# Patient Record
Sex: Female | Born: 1990 | Race: White | Hispanic: No | Smoking: Never smoker
Health system: Southern US, Community
[De-identification: ages and names within clinical notes are randomized; demographics above are authoritative.]

## PROBLEM LIST (undated history)

## (undated) DIAGNOSIS — R531 Weakness: Secondary | ICD-10-CM

## (undated) DIAGNOSIS — E538 Deficiency of other specified B group vitamins: Secondary | ICD-10-CM

## (undated) DIAGNOSIS — F5104 Psychophysiologic insomnia: Secondary | ICD-10-CM

## (undated) DIAGNOSIS — R519 Headache, unspecified: Secondary | ICD-10-CM

## (undated) DIAGNOSIS — F419 Anxiety disorder, unspecified: Secondary | ICD-10-CM

## (undated) DIAGNOSIS — E559 Vitamin D deficiency, unspecified: Secondary | ICD-10-CM

## (undated) DIAGNOSIS — G629 Polyneuropathy, unspecified: Secondary | ICD-10-CM

## (undated) DIAGNOSIS — R413 Other amnesia: Secondary | ICD-10-CM

## (undated) DIAGNOSIS — R51 Headache: Secondary | ICD-10-CM

## (undated) HISTORY — DX: Psychophysiologic insomnia: F51.04

## (undated) HISTORY — PX: CHOLECYSTECTOMY: SHX55

## (undated) HISTORY — PX: OTHER SURGICAL HISTORY: SHX169

## (undated) HISTORY — DX: Other amnesia: R41.3

## (undated) HISTORY — DX: Vitamin D deficiency, unspecified: E55.9

## (undated) HISTORY — DX: Headache: R51

## (undated) HISTORY — DX: Deficiency of other specified B group vitamins: E53.8

## (undated) HISTORY — DX: Headache, unspecified: R51.9

## (undated) HISTORY — DX: Polyneuropathy, unspecified: G62.9

---
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
## 2013-07-17 ENCOUNTER — Emergency Department: Payer: Self-pay | Admitting: Emergency Medicine

## 2013-07-17 LAB — CBC
HCT: 46.1 % (ref 35.0–47.0)
HGB: 15.4 g/dL (ref 12.0–16.0)
MCH: 30.6 pg (ref 26.0–34.0)
MCHC: 33.4 g/dL (ref 32.0–36.0)
MCV: 92 fL (ref 80–100)
Platelet: 277 10*3/uL (ref 150–440)
RBC: 5.03 10*6/uL (ref 3.80–5.20)
RDW: 12.9 % (ref 11.5–14.5)
WBC: 7.2 10*3/uL (ref 3.6–11.0)

## 2013-07-17 LAB — URINALYSIS, COMPLETE
BACTERIA: NONE SEEN
BLOOD: NEGATIVE
Bilirubin,UR: NEGATIVE
GLUCOSE, UR: NEGATIVE mg/dL (ref 0–75)
Ketone: NEGATIVE
LEUKOCYTE ESTERASE: NEGATIVE
Nitrite: NEGATIVE
Ph: 6 (ref 4.5–8.0)
Protein: NEGATIVE
RBC,UR: 1 /HPF (ref 0–5)
SPECIFIC GRAVITY: 1.012 (ref 1.003–1.030)
Squamous Epithelial: 2
WBC UR: NONE SEEN /HPF (ref 0–5)

## 2013-07-17 LAB — COMPREHENSIVE METABOLIC PANEL
ALBUMIN: 4 g/dL (ref 3.4–5.0)
ANION GAP: 9 (ref 7–16)
Alkaline Phosphatase: 55 U/L
BUN: 8 mg/dL (ref 7–18)
Bilirubin,Total: 0.4 mg/dL (ref 0.2–1.0)
CO2: 23 mmol/L (ref 21–32)
CREATININE: 0.83 mg/dL (ref 0.60–1.30)
Calcium, Total: 9.1 mg/dL (ref 8.5–10.1)
Chloride: 106 mmol/L (ref 98–107)
Glucose: 83 mg/dL (ref 65–99)
OSMOLALITY: 273 (ref 275–301)
POTASSIUM: 3.6 mmol/L (ref 3.5–5.1)
SGOT(AST): 25 U/L (ref 15–37)
SGPT (ALT): 19 U/L (ref 12–78)
Sodium: 138 mmol/L (ref 136–145)
Total Protein: 8.4 g/dL — ABNORMAL HIGH (ref 6.4–8.2)

## 2013-07-17 LAB — PREGNANCY, URINE: Pregnancy Test, Urine: NEGATIVE m[IU]/mL

## 2013-07-17 LAB — TROPONIN I

## 2013-07-18 ENCOUNTER — Emergency Department: Payer: Self-pay | Admitting: Emergency Medicine

## 2013-07-18 LAB — COMPREHENSIVE METABOLIC PANEL
AST: 19 U/L (ref 15–37)
Albumin: 4.1 g/dL (ref 3.4–5.0)
Alkaline Phosphatase: 60 U/L
Anion Gap: 9 (ref 7–16)
BUN: 7 mg/dL (ref 7–18)
Bilirubin,Total: 0.5 mg/dL (ref 0.2–1.0)
Calcium, Total: 9.5 mg/dL (ref 8.5–10.1)
Chloride: 104 mmol/L (ref 98–107)
Co2: 24 mmol/L (ref 21–32)
Creatinine: 0.7 mg/dL (ref 0.60–1.30)
EGFR (African American): >60
EGFR (Non-African Amer.): >60
Glucose: 89 mg/dL (ref 65–99)
Osmolality: 271 (ref 275–301)
POTASSIUM: 3.9 mmol/L (ref 3.5–5.1)
SGPT (ALT): 20 U/L (ref 12–78)
Sodium: 137 mmol/L (ref 136–145)
Total Protein: 8.7 g/dL — ABNORMAL HIGH (ref 6.4–8.2)

## 2013-07-18 LAB — CBC
HCT: 47.3 % — AB (ref 35.0–47.0)
HGB: 15.6 g/dL (ref 12.0–16.0)
MCH: 30.5 pg (ref 26.0–34.0)
MCHC: 33.1 g/dL (ref 32.0–36.0)
MCV: 92 fL (ref 80–100)
Platelet: 311 10*3/uL (ref 150–440)
RBC: 5.13 10*6/uL (ref 3.80–5.20)
RDW: 13.3 % (ref 11.5–14.5)
WBC: 9.9 10*3/uL (ref 3.6–11.0)

## 2013-07-18 LAB — HCG, QUANTITATIVE, PREGNANCY: Beta Hcg, Quant.: <1 m[IU]/mL — ABNORMAL LOW

## 2013-07-20 ENCOUNTER — Encounter: Payer: Self-pay | Admitting: *Deleted

## 2013-07-23 ENCOUNTER — Encounter: Payer: Self-pay | Admitting: Neurology

## 2013-07-23 ENCOUNTER — Ambulatory Visit (INDEPENDENT_AMBULATORY_CARE_PROVIDER_SITE_OTHER): Payer: BC Managed Care – PPO | Admitting: Neurology

## 2013-07-23 VITALS — BP 142/88 | HR 107 | Ht 65.5 in | Wt 208.0 lb

## 2013-07-23 DIAGNOSIS — R202 Paresthesia of skin: Secondary | ICD-10-CM

## 2013-07-23 DIAGNOSIS — R209 Unspecified disturbances of skin sensation: Secondary | ICD-10-CM

## 2013-07-23 DIAGNOSIS — R5381 Other malaise: Secondary | ICD-10-CM

## 2013-07-23 DIAGNOSIS — R531 Weakness: Secondary | ICD-10-CM

## 2013-07-23 DIAGNOSIS — R5383 Other fatigue: Secondary | ICD-10-CM

## 2013-07-23 HISTORY — DX: Paresthesia of skin: R20.2

## 2013-07-23 NOTE — Progress Notes (Signed)
[facility]    Provider:  Dr Hosie PoissonSumner Referring Provider: Lonie Peakonroy, Nathan, PA-C Primary Care Physician:  Ailene RavelHAMRICK,MAURA L, MD  CC:  Dizziness, weakness, headache  HPI:  Maria Oconnor is a 23 y.o. female here as a referral from Dr. Anna Genreonroy for dizziness and headache  2 weeks ago noted slow onset of worsening dizziness, numbness, tingling that has gotten progressively worse. Symptoms started around the lips and then progressed to the right face along with the right arm and leg. Notes having a normal MRI/A in the hospital. Has had a low grade fever intermittently. Notes generalized muscle and joint pain. Intermittent confusion and new onset blurry vision. She does report having tick bites but they were in April. Currently describes blurry vision, aching joints, tingling lips. Notes difficulty with both short term and remote memory. Has trouble getting her words out. No recent neck stiffness or neck pain.   Her PCP started treating her for Lyme disease, found out today that the testing was normal. No recent rashes. No recent travel.   Has history of migraines but infrequent and not typical of the above symptoms.   Review of Systems: Out of a complete 14 system review, the patient complains of only the following symptoms, and all other reviewed systems are negative. + fatigue, blurred vision, memory loss, confusion, headaches, numbness, joint pain, aching muscles  History   Social History  . Marital Status: Married    Spouse Name: N/A    Number of Children: N/A  . Years of Education: N/A   Occupational History  . Not on file.   Social History Main Topics  . Smoking status: Never Smoker   . Smokeless tobacco: Never Used  . Alcohol Use: Yes     Comment: occ  . Drug Use: No  . Sexual Activity: Not on file   Other Topics Concern  . Not on file   Social History Narrative   Single with no children   Right handed   Bachelor's degree   2 cups daily    Family  History  Problem Relation Age of Onset  . Hypertension Mother   . Hypertension Father     No past medical history on file.  Past Surgical History  Procedure Laterality Date  . Cholecystectomy      Age 23    Current Outpatient Prescriptions  Medication Sig Dispense Refill  . norethindrone-ethinyl estradiol (JUNEL FE,GILDESS FE,LOESTRIN FE) 1-20 MG-MCG tablet Take 1 tablet by mouth daily.       No current facility-administered medications for this visit.    Allergies as of 07/23/2013 - Review Complete 07/23/2013  Allergen Reaction Noted  . Ciprocinonide [fluocinolone] Other (See Comments) 07/20/2013    Vitals: BP 142/88  Pulse 107  Ht 5' 5.5" (1.664 m)  Wt 208 lb (94.348 kg)  BMI 34.07 kg/m2 Last Weight:  Wt Readings from Last 1 Encounters:  07/23/13 208 lb (94.348 kg)   Last Height:   Ht Readings from Last 1 Encounters:  07/23/13 5' 5.5" (1.664 m)     Physical exam: Exam: Gen: NAD, conversant Eyes: anicteric sclerae, moist conjunctivae HENT: Atraumatic, oropharynx clear Neck: Trachea midline; supple,  Lungs: CTA, no wheezing, rales, rhonic                          CV: RRR, no MRG Abdomen: Soft, non-tender;  Extremities: No peripheral edema  Skin: Normal temperature, no rash,  Psych: Appropriate affect, pleasant  Neuro:  MS: AA&Ox3, appropriately interactive, normal affect   Attention: WORLD backwards  Speech: fluent w/o paraphasic error  Memory: good recent and remote recall  CN: PERRL, VA 20/100 OU, unable to visualize optic disc due to pupil size, EOMI no nystagmus, no ptosis,V1-V3 LT decreased on left side splits the midline, vibration felt less on the left side compared to right, face symmetric, no weakness, hearing grossly intact, palate elevates symmetrically, shoulder shrug 5/5 bilat,  tongue protrudes midline, no fasiculations noted.  Motor: normal bulk and tone Strength: 5/5  In all extremities  Coord: rapid alternating and point-to-point  (FNF, HTS) movements intact.  Reflexes: symmetrical, bilat downgoing toes  Sens: LT intact in all extremities  Gait: posture, stance, stride and arm-swing normal. Slightly unsteady but corrects herself with tandem gait otherwise intact. Able to walk on heels and toes. Romberg absent.   Assessment:  After physical and neurologic examination, review of laboratory studies, imaging, neurophysiology testing and pre-existing records, assessment will be reviewed on the problem list.  Plan:  Treatment plan and additional workup will be reviewed under Problem List.  1)Paresthesias 2)Weakness 3)Joint pain 4)Fatigue 5)Cognitive decline  23y/o woman presenting for initial evaluation of multiple somatic concerns with an unclear etiology. Physical exam overall unremarkable with exception of decreased sensation to V1-3 with splitting of the midline and some decreased visual acuity. Has had normal MRI/A of the brain done at [facility] which was normal per reports. Was initially treated for possible Lyme but testing was negative. With history of tick bites will check for RMSF and also check ANA. If negative would consider LP. Counseled patient to follow up with her eye doctor for formal exam of optic discs as unable to fully visualize at this time. Splitting of the midline sensation raises question of functional component but this will be a diagnosis of exclusion. Follow up once workup completed.   Elspeth Cho, DO  [facility] [address] [place], [place] [address]  Phone [phone] Fax [phone]

## 2013-07-25 LAB — ROCKY MTN SPOTTED FVR ABS PNL(IGG+IGM)
RMSF IgG: NEGATIVE
RMSF IgM: 0.41 index (ref 0.00–0.89)

## 2013-07-25 LAB — SEDIMENTATION RATE: SED RATE: 9 mm/h (ref 0–32)

## 2013-07-25 LAB — ANA W/REFLEX IF POSITIVE: ANA: NEGATIVE

## 2013-07-27 NOTE — Progress Notes (Signed)
Quick Note:  Spoke with patient and informed her of normal results, patient expressed understanding. ______

## 2013-09-11 ENCOUNTER — Telehealth: Payer: Self-pay | Admitting: Neurology

## 2013-09-11 NOTE — Telephone Encounter (Signed)
Patient calling to get more information about the blood tests that were done when she was here in June, her PCP wants to do other tests but they want to know what we tested her for first. Please return call to patient and advise.

## 2013-09-11 NOTE — Telephone Encounter (Signed)
Please let her know we had [facility], sedimentation rate. We had discussed doing a lumbar puncture which would be the next step from our end if Dr Nathanial RancherHamrick agrees. She was also supposed to follow up with her eye doctor for a formal eye exam.

## 2013-09-11 NOTE — Telephone Encounter (Signed)
Please see previous message

## 2013-09-13 ENCOUNTER — Other Ambulatory Visit: Payer: Self-pay | Admitting: Neurology

## 2013-09-13 DIAGNOSIS — R202 Paresthesia of skin: Secondary | ICD-10-CM

## 2013-09-13 NOTE — Telephone Encounter (Signed)
Would like to set up the lumbar puncture. Please call

## 2013-09-13 NOTE — Telephone Encounter (Signed)
The order has been placed. [facility]

## 2013-09-13 NOTE — Telephone Encounter (Signed)
Spoke with patient and shared Dr Minus BreedingSumner's message with patient, she verbalized understanding , had a visit with Dr Nathanial RancherHamrick and will call back next month to schedule the lumbar puncture. She did f/u with eye doctor and she needs glasses

## 2013-09-14 NOTE — Telephone Encounter (Signed)
Called and lt VM message of Dr Minus BreedingSumner's note below

## 2013-09-21 ENCOUNTER — Ambulatory Visit
Admission: RE | Admit: 2013-09-21 | Discharge: 2013-09-21 | Disposition: A | Discharge status: Home / Self Care | Payer: BC Managed Care – PPO | Source: Ambulatory Visit | Attending: Neurology | Admitting: Neurology

## 2013-09-21 VITALS — BP 127/87 | HR 84

## 2013-09-21 DIAGNOSIS — R202 Paresthesia of skin: Secondary | ICD-10-CM

## 2013-09-21 LAB — CSF CELL COUNT WITH DIFFERENTIAL
RBC Count, CSF: 0 cu mm
Tube #: 1
WBC, CSF: 0 cu mm (ref 0–5)

## 2013-09-21 LAB — PROTEIN, CSF: TOTAL PROTEIN, CSF: 39 mg/dL (ref 15–45)

## 2013-09-21 MED ORDER — DIAZEPAM 5 MG PO TABS
5.0000 mg | ORAL_TABLET | Freq: Once | ORAL | Status: AC
  Administered 2013-09-21: 5 mg via ORAL

## 2013-09-21 NOTE — Progress Notes (Signed)
Blood drawn from right hand thru #24 angio. Pt has limited venous access and got minimum blood for test requested. Site is unremarkable and pt tolerated procedure well.

## 2013-09-21 NOTE — Discharge Instructions (Signed)

## 2013-09-21 NOTE — Progress Notes (Signed)
Discharge instructions explained to patient and her mother prior to LP.

## 2013-09-22 LAB — CYTOMEGALOVIRUS PCR, QUALITATIVE: CMV DNA, Qual PCR: NOT DETECTED

## 2013-09-22 LAB — VARICELLA-ZOSTER BY PCR: VZV DNA, QL PCR: NOT DETECTED

## 2013-09-23 LAB — ENTEROVIRUS PCR: Enterovirus RNA, RT-PCR: NOT DETECTED

## 2013-09-23 LAB — BORRELIA SPECIES DNA, FLUID, PCR: BBURGDNAFLU: NOT DETECTED

## 2013-09-24 LAB — CSF CULTURE W GRAM STAIN: Organism ID, Bacteria: NO GROWTH

## 2013-09-24 LAB — CSF CULTURE
GRAM STAIN: NONE SEEN
Gram Stain: NONE SEEN

## 2013-09-25 LAB — B. BURGDORFI ANTIBODIES: B burgdorferi Ab IgG+IgM: 0.66 {ISR}

## 2013-09-25 LAB — MYELIN BASIC PROTEIN, CSF

## 2013-09-25 LAB — WEST NILE AB, IGG AND IGM, CSF
West Nile Ab, IgG, CSF: <1.3 index (ref <1.30)
West Nile Ab, IgM, CSF: <0.9 index (ref <0.90)

## 2013-09-26 LAB — OLIGOCLONAL BANDS, CSF + SERM

## 2013-09-26 LAB — HSV(HERPES SMPLX VRS)ABS-I+II(IGG)-CSF

## 2013-09-27 ENCOUNTER — Telehealth: Payer: Self-pay | Admitting: Neurology

## 2013-09-27 NOTE — Telephone Encounter (Signed)
Patient requesting lumbar puncture results, please call back and advise.

## 2013-10-01 LAB — B. BURGDORFI ANTIBODIES, CSF: Lyme Ab: NOT DETECTED

## 2013-10-01 NOTE — Telephone Encounter (Signed)
Patient calling to state she has been waiting for awhile for her lumbar puncture results, states that she would really like to hear something back today to get some peace of mind. Please return call and advise.

## 2013-10-02 ENCOUNTER — Telehealth: Payer: Self-pay | Admitting: Neurology

## 2013-10-02 NOTE — Telephone Encounter (Signed)
Please let her know I am still waiting for a few tests but overall everything looks normal so far and it does not appear to be consistent with multiple sclerosis. Thanks.

## 2013-10-02 NOTE — Telephone Encounter (Signed)
Please review LP and state comments. Patient has been inquiring results.

## 2013-10-02 NOTE — Telephone Encounter (Signed)
Please advise previous note. Thanks  °

## 2013-10-02 NOTE — Telephone Encounter (Signed)
Called pt and left message informing her per Dr. Hosie PoissonSumner that he is still waiting for a few tests but overall everything looks normal so far and it does not appear to be consistent with multiple sclerosis and if she has any other problems, questions or concerns to call the office.

## 2013-10-02 NOTE — Telephone Encounter (Signed)
Patient calling back requesting  results of recent testing.

## 2013-10-08 NOTE — Telephone Encounter (Signed)
Patient calling to see if additional results have come in for Lumbar Puncture?  Please call anytime and if not available please leave detailed message.

## 2013-10-10 ENCOUNTER — Other Ambulatory Visit: Payer: Self-pay | Admitting: Neurology

## 2013-10-10 DIAGNOSIS — R202 Paresthesia of skin: Secondary | ICD-10-CM

## 2013-10-10 NOTE — Telephone Encounter (Signed)
Call returned and all questions answered. Patient referred to Rheumatology for further workup and evaluation.

## 2013-10-10 NOTE — Telephone Encounter (Signed)
Please review and advise.

## 2013-10-24 ENCOUNTER — Telehealth: Payer: Self-pay

## 2013-10-24 NOTE — Telephone Encounter (Signed)
[facility] called form Dr. Mills Koller office Rheumatology. Dr.Beekman states this patient should be sent to [facility] . [place] [facility] [place] with Ellery Plunk she stated that notes and orders needed to be faxed to [phone] for Dr.Review Ellery Plunk will contact the office back if there Doctors approve.   Patient has been contacted and she is aware of what is going on with process she understood.

## 2013-11-07 DIAGNOSIS — G629 Polyneuropathy, unspecified: Secondary | ICD-10-CM | POA: Insufficient documentation

## 2013-12-03 ENCOUNTER — Institutional Professional Consult (permissible substitution): Payer: BC Managed Care – PPO | Admitting: Neurology

## 2013-12-28 ENCOUNTER — Emergency Department (HOSPITAL_COMMUNITY): Payer: BC Managed Care – PPO

## 2013-12-28 ENCOUNTER — Emergency Department (HOSPITAL_COMMUNITY)
Admission: EM | Admit: 2013-12-28 | Discharge: 2013-12-28 | Disposition: A | Discharge status: Home / Self Care | Payer: BC Managed Care – PPO | Attending: Emergency Medicine | Admitting: Emergency Medicine

## 2013-12-28 ENCOUNTER — Encounter (HOSPITAL_COMMUNITY): Payer: Self-pay | Admitting: Family Medicine

## 2013-12-28 DIAGNOSIS — R079 Chest pain, unspecified: Secondary | ICD-10-CM

## 2013-12-28 DIAGNOSIS — Z8659 Personal history of other mental and behavioral disorders: Secondary | ICD-10-CM | POA: Insufficient documentation

## 2013-12-28 DIAGNOSIS — Z3202 Encounter for pregnancy test, result negative: Secondary | ICD-10-CM | POA: Diagnosis not present

## 2013-12-28 DIAGNOSIS — Z79899 Other long term (current) drug therapy: Secondary | ICD-10-CM | POA: Insufficient documentation

## 2013-12-28 DIAGNOSIS — R0789 Other chest pain: Secondary | ICD-10-CM | POA: Diagnosis not present

## 2013-12-28 HISTORY — DX: Weakness: R53.1

## 2013-12-28 HISTORY — DX: Anxiety disorder, unspecified: F41.9

## 2013-12-28 LAB — D-DIMER, QUANTITATIVE (NOT AT ARMC): D DIMER QUANT: 0.52 ug{FEU}/mL — AB (ref 0.00–0.48)

## 2013-12-28 LAB — POC URINE PREG, ED: PREG TEST UR: NEGATIVE

## 2013-12-28 LAB — BASIC METABOLIC PANEL
Anion gap: 14 (ref 5–15)
BUN: 12 mg/dL (ref 6–23)
CO2: 20 meq/L (ref 19–32)
Calcium: 9.2 mg/dL (ref 8.4–10.5)
Chloride: 105 mEq/L (ref 96–112)
Creatinine, Ser: 0.75 mg/dL (ref 0.50–1.10)
GFR calc Af Amer: >90 mL/min (ref >90)
GFR calc non Af Amer: >90 mL/min (ref >90)
GLUCOSE: 94 mg/dL (ref 70–99)
POTASSIUM: 4.1 meq/L (ref 3.7–5.3)
SODIUM: 139 meq/L (ref 137–147)

## 2013-12-28 LAB — CBC
HCT: 43.9 % (ref 36.0–46.0)
HEMOGLOBIN: 14.3 g/dL (ref 12.0–15.0)
MCH: 29.9 pg (ref 26.0–34.0)
MCHC: 32.6 g/dL (ref 30.0–36.0)
MCV: 91.6 fL (ref 78.0–100.0)
Platelets: 266 10*3/uL (ref 150–400)
RBC: 4.79 MIL/uL (ref 3.87–5.11)
RDW: 12.5 % (ref 11.5–15.5)
WBC: 7.5 10*3/uL (ref 4.0–10.5)

## 2013-12-28 LAB — I-STAT TROPONIN, ED: TROPONIN I, POC: 0 ng/mL (ref 0.00–0.08)

## 2013-12-28 MED ORDER — HYDROCODONE-ACETAMINOPHEN 5-325 MG PO TABS: 1.0000 | ORAL_TABLET | Freq: Four times a day (QID) | ORAL | Status: DC | PRN

## 2013-12-28 MED ORDER — IOHEXOL 350 MG/ML SOLN
80.0000 mL | Freq: Once | INTRAVENOUS | Status: AC | PRN
  Administered 2013-12-28: 80 mL via INTRAVENOUS

## 2013-12-28 NOTE — ED Notes (Signed)
MD Walden at the bedside. 

## 2013-12-28 NOTE — ED Provider Notes (Signed)
CSN: [id number]     Arrival date & time 12/28/13  1305 History   First MD Initiated Contact with Patient 12/28/13 1308     Chief Complaint  Patient presents with  . Chest Pain     (Consider location/radiation/quality/duration/timing/severity/associated sxs/prior Treatment) Patient is a 23 y.o. female presenting with chest pain.  Chest Pain Pain location:  Substernal area Pain quality: sharp and tightness   Pain radiates to:  Upper back Pain radiates to the back: no   Pain severity:  Moderate Onset quality:  Gradual Timing:  Constant Progression:  Unchanged Chronicity:  Recurrent (had similar episode to this 5 days ago) Context: at rest   Context: not breathing, no drug use, not lifting and no movement   Relieved by:  Nothing Worsened by:  Nothing tried Associated symptoms: no abdominal pain, no cough, no fever and not vomiting   Associated symptoms comment:  General malaise for past five days Risk factors: birth control     Past Medical History  Diagnosis Date  . Anxiety   . Right sided weakness    Past Surgical History  Procedure Laterality Date  . Cholecystectomy      Age 23   Family History  Problem Relation Age of Onset  . Hypertension Mother   . Hypertension Father    History  Substance Use Topics  . Smoking status: Never Smoker   . Smokeless tobacco: Never Used  . Alcohol Use: Yes     Comment: occ   OB History    No data available     Review of Systems  Constitutional: Negative for fever.  Respiratory: Negative for cough.   Cardiovascular: Positive for chest pain.  Gastrointestinal: Negative for vomiting and abdominal pain.  All other systems reviewed and are negative.     Allergies  Ciprocinonide  Home Medications   Prior to Admission medications   Medication Sig Start Date End Date Taking? Authorizing Provider  norethindrone-ethinyl estradiol (JUNEL FE,GILDESS FE,LOESTRIN FE) 1-20 MG-MCG tablet Take 1 tablet by mouth daily.     Historical Provider, MD   BP 140/74 mmHg  Pulse 105  Temp(Src) 98.6 F (37 C) (Oral)  Resp 20  Ht 5\' 5"  (1.651 m)  Wt 215 lb (97.523 kg)  BMI 35.78 kg/m2  SpO2 100%  LMP 12/07/2013 Physical Exam  Constitutional: She is oriented to person, place, and time. She appears well-developed and well-nourished. No distress.  HENT:  Head: Normocephalic and atraumatic.  Mouth/Throat: Oropharynx is clear and moist.  Eyes: EOM are normal. Pupils are equal, round, and reactive to light.  Neck: Normal range of motion. Neck supple.  Cardiovascular: Normal rate and regular rhythm.  Exam reveals no friction rub.   No murmur heard. Pulmonary/Chest: Effort normal and breath sounds normal. No respiratory distress. She has no wheezes. She has no rales.  Abdominal: Soft. She exhibits no distension. There is no tenderness. There is no rebound.  Musculoskeletal: Normal range of motion. She exhibits no edema.  Neurological: She is alert and oriented to person, place, and time.  Skin: No rash noted. She is not diaphoretic.  Nursing note and vitals reviewed.   ED Course  Procedures (including critical care time) Labs Review Labs Reviewed  CBC  BASIC METABOLIC PANEL  D-DIMER, QUANTITATIVE  I-STAT TROPOININ, ED    Imaging Review Dg Chest 2 View  12/28/2013   CLINICAL DATA:  Chest pain extending down the right arm. Symptoms for 5 days. Shortness of breath and nausea today.  EXAM: CHEST  2 VIEW  COMPARISON:  07/10/2004  FINDINGS: Heart size is normal. The lungs are clear. No pulmonary edema. Surgical clips are present in the right upper quadrant of the abdomen. Anomalous right upper ribs again noted.  IMPRESSION: No active cardiopulmonary disease.   Electronically Signed   By: Rosalie Gums M.D.   On: 12/28/2013 13:51   Ct Angio Chest Pe W/cm &/or Wo Cm  12/28/2013   CLINICAL DATA:  23 year old female with right-sided chest pain, right arm pain and shortness of breath. Symptoms have been progressive over  the past week.  EXAM: CT ANGIOGRAPHY CHEST WITH CONTRAST  TECHNIQUE: Multidetector CT imaging of the chest was performed using the standard protocol during bolus administration of intravenous contrast. Multiplanar CT image reconstructions and MIPs were obtained to evaluate the vascular anatomy.  CONTRAST:  80mL OMNIPAQUE IOHEXOL 350 MG/ML SOLN  COMPARISON:  Chest x-ray 12/28/2013 ; prior CT abdomen/ pelvis 05/08/2012  FINDINGS: Mediastinum: Unremarkable CT appearance of the thyroid gland. No suspicious mediastinal or hilar adenopathy. No soft tissue mediastinal mass. The thoracic esophagus is unremarkable.  Heart/Vascular: Adequate opacification of the pulmonary arteries to the proximal subsegmental level. No a central filling defect to suggest acute pulmonary embolus. Normal caliber aorta. The heart within normal limits for size. No pericardial effusion.  Lungs/Pleura: No pleural effusion. The lungs are clear save for mild dependent atelectasis in the lower lobes.  Bones/Soft Tissues: Abnormal configuration of the right third and fourth ribs. 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 The remainder the right third rib is relatively hypoplastic. There is osseous bridging between the right third and fourth ribs in the right fourth rib is relatively hypoplastic and discontinuous at the posterolateral aspect. It is unclear if this represents the sequelae of remote prior trauma, remote prior surgical intervention or congenital variation. No acute fracture or aggressive appearing lytic or blastic osseous lesion.  Upper Abdomen: Surgical changes of prior cholecystectomy. Otherwise, the visualized upper abdomen is unremarkable.  Review of the MIP images confirms the above findings.  IMPRESSION: 1. Negative for acute pulmonary embolus, pneumonia or other acute cardiopulmonary process. 2. Abnormal configuration of the right third and fourth ribs as described above. It is unclear if this  represents the sequelae of a remote prior trauma or partial surgical resection or if this represents congenital variation. If the patient's pain is centered in the superior posterolateral right chest, the underlying rib abnormalities may be related to the clinical symptoms.   Electronically Signed   By: Malachy Moan M.D.   On: 12/28/2013 16:20     EKG Interpretation   Date/Time:  Friday December 28 2013 13:13:03 EST Ventricular Rate:  109 PR Interval:  172 QRS Duration: 95 QT Interval:  346 QTC Calculation: 466 R Axis:   78 Text Interpretation:  Sinus tachycardia RSR' in V1 or V2, probably normal  variant Borderline T abnormalities, inferior leads Baseline wander in  lead(s) V6 No prior for comparison Confirmed by Gwendolyn Grant  MD, Shanyiah Conde ([id number])  on 12/28/2013 1:34:56 PM     Angiocath insertion Performed by: Dagmar Hait  Consent: Verbal consent obtained. Risks and benefits: risks, benefits and alternatives were discussed Time out: Immediately prior to procedure a "time out" was called to verify the correct patient, procedure, equipment, support staff and site/side marked as required.  Preparation: Patient was prepped and draped in the usual sterile fashion.  Vein Location: R AC  Yes Ultrasound Guided  Gauge: 20  Normal blood  return and flush without difficulty Patient tolerance: Patient tolerated the procedure well with no immediate complications.    MDM   Final diagnoses:  Chest pain    25F here with chest pain. Sharp tightness in central chest, radiating to upper back and neck. Same episode occurred 5 days ago, was worse then. No pain for past 5 days until today, however had general malaise. Hx of R-sided weakness - had normal MRI. Exam benign. Will check labs including D-dimer. Dimer elevated, negative PE scan. Stable for discharge. Given pain meds, resource guide.   I have reviewed all labs and imaging and considered them in my medical decision  making.   Elwin MochaBlair Hakim Minniefield, MD 12/28/13 [phone]

## 2013-12-28 NOTE — ED Notes (Signed)
Pt returned from X-ray.  

## 2013-12-28 NOTE — ED Notes (Signed)
Attempted Iv x1. Unable to attempt. MD Gwendolyn GrantWalden to attempt US. Patient made aware.

## 2013-12-28 NOTE — ED Notes (Signed)
Patient returned from CT

## 2013-12-28 NOTE — Discharge Instructions (Signed)
Chest Pain (Nonspecific) °It is often hard to give a specific diagnosis for the cause of chest pain. There is always a chance that your pain could be related to something serious, such as a heart attack or a blood clot in the lungs. You need to follow up with your health care provider for further evaluation. °CAUSES  °· Heartburn. °· Pneumonia or bronchitis. °· Anxiety or stress. °· Inflammation around your heart (pericarditis) or lung (pleuritis or pleurisy). °· A blood clot in the lung. °· A collapsed lung (pneumothorax). It can develop suddenly on its own (spontaneous pneumothorax) or from trauma to the chest. °· Shingles infection (herpes zoster virus). °The chest wall is composed of bones, muscles, and cartilage. Any of these can be the source of the pain. °· The bones can be bruised by injury. °· The muscles or cartilage can be strained by coughing or overwork. °· The cartilage can be affected by inflammation and become sore (costochondritis). °DIAGNOSIS  °Lab tests or other studies may be needed to find the cause of your pain. Your health care provider may have you take a test called an ambulatory electrocardiogram (ECG). An ECG records your heartbeat patterns over a 24-hour period. You may also have other tests, such as: °· Transthoracic echocardiogram (TTE). During echocardiography, sound waves are used to evaluate how blood flows through your heart. °· Transesophageal echocardiogram (TEE). °· Cardiac monitoring. This allows your health care provider to monitor your heart rate and rhythm in real time. °· Holter monitor. This is a portable device that records your heartbeat and can help diagnose heart arrhythmias. It allows your health care provider to track your heart activity for several days, if needed. °· Stress tests by exercise or by giving medicine that makes the heart beat faster. °TREATMENT  °· Treatment depends on what may be causing your chest pain. Treatment may include: °¨ Acid blockers for  heartburn. °¨ Anti-inflammatory medicine. °¨ Pain medicine for inflammatory conditions. °¨ Antibiotics if an infection is present. °· You may be advised to change lifestyle habits. This includes stopping smoking and avoiding alcohol, caffeine, and chocolate. °· You may be advised to keep your head raised (elevated) when sleeping. This reduces the chance of acid going backward from your stomach into your esophagus. °Most of the time, nonspecific chest pain will improve within 2-3 days with rest and mild pain medicine.  °HOME CARE INSTRUCTIONS  °· If antibiotics were prescribed, take them as directed. Finish them even if you start to feel better. °· For the next few days, avoid physical activities that bring on chest pain. Continue physical activities as directed. °· Do not use any tobacco products, including cigarettes, chewing tobacco, or electronic cigarettes. °· Avoid drinking alcohol. °· Only take medicine as directed by your health care provider. °· Follow your health care provider's suggestions for further testing if your chest pain does not go away. °· Keep any follow-up appointments you made. If you do not go to an appointment, you could develop lasting (chronic) problems with pain. If there is any problem keeping an appointment, call to reschedule. °SEEK MEDICAL CARE IF:  °· Your chest pain does not go away, even after treatment. °· You have a rash with blisters on your chest. °· You have a fever. °SEEK IMMEDIATE MEDICAL CARE IF:  °· You have increased chest pain or pain that spreads to your arm, neck, jaw, back, or abdomen. °· You have shortness of breath. °· You have an increasing cough, or you cough   up blood. °· You have severe back or abdominal pain. °· You feel nauseous or vomit. °· You have severe weakness. °· You faint. °· You have chills. °This is an emergency. Do not wait to see if the pain will go away. Get medical help at once. Call your local emergency services (911 in [place] Do not drive  yourself to the hospital. °MAKE SURE YOU:  °· Understand these instructions. °· Will watch your condition. °· Will get help right away if you are not doing well or get worse. °Document Released: 11/11/2004 Document Revised: 02/06/2013 Document Reviewed: 09/07/2007 °ExitCare® Patient Information ©2015 [employer]. This information is not intended to replace advice given to you by your health care provider. Make sure you discuss any questions you have with your health care provider. ° ° °Emergency Department Resource Guide °1) Find a Doctor and Pay Out of Pocket °Although you won't have to find out who is covered by your insurance plan, it is a good idea to ask around and get recommendations. You will then need to call the office and see if the doctor you have chosen will accept you as a new patient and what types of options they offer for patients who are self-pay. Some doctors offer discounts or will set up payment plans for their patients who do not have insurance, but you will need to ask so you aren't surprised when you get to your appointment. ° °2) Contact Your Local Health Department °Not all health departments have doctors that can see patients for sick visits, but many do, so it is worth a call to see if yours does. If you don't know where your local health department is, you can check in your phone book. The CDC also has a tool to help you locate your state's health department, and many state websites also have listings of all of their local health departments. ° °3) Find a Walk-in Clinic °If your illness is not likely to be very severe or complicated, you may want to try a walk in clinic. These are popping up all over the country in pharmacies, drugstores, and shopping centers. They're usually staffed by nurse practitioners or physician assistants that have been trained to treat common illnesses and complaints. They're usually fairly quick and inexpensive. However, if you have serious medical issues or  chronic medical problems, these are probably not your best option. ° °No Primary Care Doctor: °- Call [facility] at  832-8000 - they can help you locate a primary care doctor that  accepts your insurance, provides certain services, etc. °- Physician Referral Service- 1-800-533-3463 ° °Chronic Pain Problems: °Organization         Address  Phone   Notes  °[facility]  (336) 297-2271 Patients need to be referred by their primary care doctor.  ° °Medication Assistance: °Organization         Address  Phone   Notes  °[place] [facility] [address] °[place], [place] [address] (336) 641-8030 --Must be a resident of [place] °-- Must have NO insurance coverage whatsoever (no Medicaid/ Medicare, etc.) °-- The pt. MUST have a primary care doctor that directs their care regularly and follows them in the community °  °[facility]  (866) 331-1348   °[employer]  (888) 892-1162   ° °Agencies that provide inexpensive medical care: °Organization         Address  Phone   Notes  °[facility]  (  336) 832-8035   °[facility]    (336) 832-7272   °[facility] [address] °[place], [place] [address] (336) 832-4777   °[facility] [address], °[place] (336) 271-4999   °[facility]    (336) 373-0678   °[facility]    (336) 272-1050   °[facility] ° [address], [place] Phone:  (336) 832-4444, Fax:  (336) 832-4440 Hours of Operation:  9 am - 6 pm, M-F.  Also accepts Medicaid/Medicare and self-pay.  °[facility] ° [address], [place] Phone: (336) [phone], Fax: (336) [phone]. Hours of Operation:  8:30 am - 5:30 pm, M-F.  Also accepts Medicaid and self-pay.  °[facility] [place] [address], [place] Phone: (336) 878-6027   °[facility] [address], [place], [place]  (336)723-1848, Ext. 123 Mondays & Thursdays: 7-9 AM.  First 15 patients are seen on a first come, first serve basis. °  ° °Medicaid-accepting [place] Providers: ° °Organization         Address  Phone   Notes  °[facility] [address], [place] (336) 641-2100 Also accepts self-pay patients.  °[facility] [address], [place] ° (336) 856-9996   °[facility] [address], [place] (336) 288-8857   °[facility] [address] [place] [address], [place] (336) 299-7000   °Veita Bland [address], [place]  ° (336) 373-1557 Only accepts [place] Access Medicaid patients after they have their name applied to their card.  ° °Self-Pay (no insurance) in [place]: ° °Organization         Address  Phone   Notes  °Sickle Cell Patients, [facility] [address], [place] (336) 832-1970   °[facility] [address], [place] (336) 832-4400   ° [facility] ° [address], [place] (336) 992-4800   °[facility]/Dr. Osei-Bonsu ° [address] [place] [address], [place] or [address], [place] (336) [phone] Phone number for both [place] and [place] locations is the same.  °[facility] [address], [place] (336) 299-0000   °[facility] [address] [place] [address], [place] or [address] (336) 852-7530 °(336) 878-2260   °[facility] [address], [place] (336) 350-1642, phone; (336) 294-5005, fax Sees patients 1st and 3rd Saturday of every month.  Must not qualify for public or private insurance (i.e. Medicaid, Medicare, [place] Health Choice, Veterans' Benefits) • Household income should be no more than 200% of the poverty level •The clinic cannot treat you if you are pregnant or think you are pregnant • Sexually transmitted  diseases are not treated at the clinic.  ° ° °Dental Care: °Organization         Address  Phone  Notes  °[facility] [address],  (336) 641-6152 Accepts children up to age 21 who are enrolled in Medicaid or  Health Choice; pregnant women with a Medicaid card; and children who have applied for Medicaid or  Health Choice, but were declined, whose parents can pay a reduced fee at time of service.  °[facility] [place]    [address], [place] (336) 641-7733 Accepts children up to age 21 who are enrolled in Medicaid or [place] Health Choice; pregnant women with a Medicaid card; and children who have applied for Medicaid or [place] Health Choice, but were declined, whose parents can pay a reduced fee at time of service.  °[facility] ° [address], [place] (336) 641-4533 Patients are seen by appointment only. Walk-ins are not accepted. [facility] will see patients 18 years of age and older. °Monday - Tuesday (8am-5pm) °Most Wednesdays (8:30-5pm) °$30 per visit, cash only  °[facility] ° [address], [place] (336) 641-4533 Patients are seen by appointment only. Walk-ins are not accepted. [facility] will see patients 18 years of age and older. °One Wednesday Evening (Monthly: Volunteer Based).  $30 per visit, cash only  °[facility]  (919) 537-3737 for adults; Children under age 4, call Graduate Pediatric Dentistry at ([phone]) [phone]. Children aged 4-14, please call ([phone]) [phone] to request a pediatric application. ° Dental services are provided in all areas of dental care including fillings, crowns and bridges, complete and partial dentures, implants, gum treatment, root canals, and extractions. Preventive care is also provided. Treatment is provided to both adults and children. °Patients are selected via a  lottery and there is often a waiting list. °  °[facility] [address], °[place] ° (336) 763-8833 [email or web address] °  °[facility] [address], [place], [place] (336)723-1848, Ext. 123 Second and Fourth Thursday of each month, opens at 6:30 AM; Clinic ends at 9 AM.  Patients are seen on a first-come first-served basis, and a limited number are seen during each clinic.  ° °[facility] ° [address], [place], [place] (336) 723-7904   Eligibility Requirements °You must have lived in [place], [place], or [place] counties for at least the last three months. °  You cannot be eligible for state or federal sponsored healthcare insurance, including Veterans Administration, Medicaid, or Medicare. °  You generally cannot be eligible for healthcare insurance through your employer.  °  How to apply: °Eligibility screenings are held every Tuesday and Wednesday afternoon from 1:00 pm until 4:00 pm. You do not need an appointment for the interview!  °[facility] [address], [place], [place] 336-631-2330   °[facility]  336-342-8273   °[facility]  336-703-3100   °[facility]  336-570-6415   ° °Behavioral Health Resources in the Community: °Intensive Outpatient Programs °Organization         Address  Phone  Notes  °[facility] [address], [place], [place] 336-878-6098   °[facility] [address], [place], [place] 336-832-9800   °[facility] [address], [place], [place] ° 336-882-2125   °[facility] [address],  °[place], [place] 1-800-853-5163 or 336-641-4981   °Substance Abuse Resources °Organization         Address  Phone  Notes  °[facility]  336-882-2125   °[facility]  336-784-9470   °[facility]  336-285-9073   °[facility]  336-845-3988   °Residential &  Outpatient Substance Abuse Program  1-800-659-3381   °Psychological Services °Organization         Address  Phone  Notes  °[facility]  336- 832-9600   °  [facility]  336- 378-7881   °[facility] [address], [place] 1-800-853-5163 or 336-641-4981   ° °Mobile Crisis Teams °Organization         Address  Phone  Notes  °[facility], Mobile Crisis Care Unit  1-877-626-1772   °Assertive °[facility] ° [address] [place], [place] 336-834-9664   °Sharon DeEsch [address] °[place] 336-554-5454   ° °Self-Help/Support Groups °Organization         Address  Phone             Notes  °[facility] - variety of support groups  336- 373-1402 Call for more information  °Narcotics Anonymous (NA), [facility] [address], °[place]  2 meetings at this location  ° °Residential Treatment Programs °Organization         Address  Phone  Notes  °[facility] [address],    °[place]  1-866-801-8205   °[facility] ° [address], [place], [place] 704-293-8524   °[facility] [address], [place] 336-845-3988 Admissions: 8am-3pm M-F  °[facility] [address]    °[place], [place] 336-841-1104   °[facility] [address], [place], [place] 336-379-7146   °[facility] [address]  °[place], [place] 336-285-9073   °[facility] Intensive Outpatient [address], [place], [place] 336-852-3033   °[facility] [address]  °[place], [place] 1-877-615-2722 or 336-784-9470   °[facility] ([facility] [address] [place], [place] 336-227-7417 Accepts Medicaid  °[facility] [address]  ° [place] 1-800-659-3381 Substance Abuse/Addiction Treatment  ° °[place] Behavioral Health Resources °Organization          Address  Phone  Notes  °[facility]  (888) 581-9988   °Julie Brannon, PhD [address], [place]   (336) 349-5553 or (336) 951-0000   °[facility]   [address] °[place], [place] (336) 349-4454   °[facility] [address], [place], [place] (336) 342-8316 Insurance/Medicaid/sponsorship through [facility]  °[facility] [address]                                    [place], [place] (336) 342-8316 Therapy/tele-psych/case  °[facility] [address]  ° [place], [place] (336) 349-2233    °Dr. Arfeen  (336) 349-4544   °[facility] [place]  [employer] [place] [facility] 1) [address], [place] °2) [address], [place] °3)  [address], [place] (336) 349-3220 °(336) 342-7768 ° °(336) 342-8140   °[place] [facility] (336) 342-1394 or (336) 342-3537 (After Hours)    ° ° ° °

## 2013-12-28 NOTE — ED Notes (Signed)
Pt presents from work via EMS with c/o central sharp chest pain with radiation to the left arm - describes arm pain as "Aching".  Reports had this same pain this past Sunday that dissipated on Sunday.  Pt has history of similar symptoms in the past.  Pt is A&Ox4, NSR/ST in NAD.

## 2013-12-31 ENCOUNTER — Telehealth: Payer: Self-pay | Admitting: *Deleted

## 2013-12-31 ENCOUNTER — Ambulatory Visit (INDEPENDENT_AMBULATORY_CARE_PROVIDER_SITE_OTHER): Payer: BC Managed Care – PPO

## 2013-12-31 ENCOUNTER — Encounter: Payer: Self-pay | Admitting: Neurology

## 2013-12-31 ENCOUNTER — Ambulatory Visit (INDEPENDENT_AMBULATORY_CARE_PROVIDER_SITE_OTHER): Payer: BC Managed Care – PPO | Admitting: Neurology

## 2013-12-31 VITALS — BP 133/88 | HR 123 | Ht 65.5 in | Wt 218.0 lb

## 2013-12-31 DIAGNOSIS — M791 Myalgia, unspecified site: Secondary | ICD-10-CM

## 2013-12-31 DIAGNOSIS — R531 Weakness: Secondary | ICD-10-CM

## 2013-12-31 DIAGNOSIS — R5381 Other malaise: Secondary | ICD-10-CM | POA: Insufficient documentation

## 2013-12-31 DIAGNOSIS — G44209 Tension-type headache, unspecified, not intractable: Secondary | ICD-10-CM

## 2013-12-31 DIAGNOSIS — M6289 Other specified disorders of muscle: Secondary | ICD-10-CM

## 2013-12-31 DIAGNOSIS — R519 Headache, unspecified: Secondary | ICD-10-CM

## 2013-12-31 DIAGNOSIS — H539 Unspecified visual disturbance: Secondary | ICD-10-CM

## 2013-12-31 DIAGNOSIS — F411 Generalized anxiety disorder: Secondary | ICD-10-CM

## 2013-12-31 DIAGNOSIS — R42 Dizziness and giddiness: Secondary | ICD-10-CM

## 2013-12-31 DIAGNOSIS — R5383 Other fatigue: Secondary | ICD-10-CM

## 2013-12-31 DIAGNOSIS — F32A Depression, unspecified: Secondary | ICD-10-CM

## 2013-12-31 DIAGNOSIS — R51 Headache: Secondary | ICD-10-CM

## 2013-12-31 DIAGNOSIS — F329 Major depressive disorder, single episode, unspecified: Secondary | ICD-10-CM

## 2013-12-31 DIAGNOSIS — R202 Paresthesia of skin: Secondary | ICD-10-CM

## 2013-12-31 DIAGNOSIS — M542 Cervicalgia: Secondary | ICD-10-CM

## 2013-12-31 HISTORY — DX: Dizziness and giddiness: R42

## 2013-12-31 NOTE — Progress Notes (Addendum)
[facility]    Provider:  Dr Jaynee Eagles Referring Provider: Leonides Sake, MD Primary Care Physician:  Leonides Sake, MD  CC:  Dizziness and headache  HPI:  Maria Oconnor is a 23 y.o. female here as a follow up for dizziness and headache. She is a former Dr. Janann Colonel patient and is transferring care. Per Dr. Hazle Quant notes, she presented with diffuse somatic complaints; worsening dizziness, numbness, tingling that started along the right face then spread to the right side of the body, muscle and joint pain, blurry vision, tingling lips, difficulty with both short and long term memory, word finding difficulty. Was recently in the hospital for CP and general malaise, EKG showed sinus tachycardia with negative PE scan and was discharged from the ED. Previous labs include the following negative or normal; ANA, ESR, RMSF IgG/IgM. CSF workup negative or normal including: cell cnt w diff, lyme antibodies, west nile ab, lyme dna, VZ pcr, MBP, culture, protein, HSV abs, enterovirus pcr, cmv pcr, oligoclonal bands. Per notes, MRi of the brain normal however I don't have images or report.  She says she still has tingling in the right side, muscle weakness on the right so she has to use her left side more. She says some days she walks with a limp with her right leg. She can;t write with right hand some days. Also with blurry vision. The weakness is constant, hasn't changed since June. Sometimes she can write, sometimes she can't. Never improves. Never had an emg/ncs. She had speech slurring in June, couldn't think and couldn't talk and then her right body went numb and at that time they did the MRI. She had CP this past Friday and went to the ED. She is still having pains going down both arms, SOB, nausea. "It is all I can do to just walk around". Lots of fatigue. She is going to see a primary care physician today. Left side is fine, not weak. Tingling is on the right entire arm, right entire  leg, right side of the face, not on the thorax. Also having tingling in the left hand and foot. She feels numbness on both feet. No diabetes. No FHx of neuromuscular or neurodegenerative or rheumatologic disorders in the family. Symptoms have been going on since June, acute onset. She also reports she has neck pain and feels like her neck is going to to explode.   Review of Systems: Patient complains of symptoms per HPI as well as the following symptoms appetite change, fatigue, SOB, CP, nausea, memory loss, dizziness, headache, speech difficulty, weakness, tremors, joint pain, back pain, aching muscles, confusion. Pertinent negatives per HPI. All others negative.   History   Social History  . Marital Status: Married    Spouse Name: N/A    Number of Children: 0  . Years of Education: N/A   Occupational History  . Not on file.   Social History Main Topics  . Smoking status: Never Smoker   . Smokeless tobacco: Never Used  . Alcohol Use: Yes     Comment: occ  . Drug Use: No  . Sexual Activity: Not on file   Other Topics Concern  . Not on file   Social History Narrative   Single with no children   Right handed   Bachelor's degree   2 cups daily    Family History  Problem Relation Age of Onset  . Hypertension Mother   . Hypertension Father     Past Medical History  Diagnosis Date  . Anxiety   . Right sided weakness   . Headache   . Memory loss   . Neuropathy     Past Surgical History  Procedure Laterality Date  . Cholecystectomy      Age 23    Current Outpatient Prescriptions  Medication Sig Dispense Refill  . escitalopram (LEXAPRO) 10 MG tablet Take 10 mg by mouth at bedtime.    Marland Kitchen HYDROcodone-acetaminophen (NORCO/VICODIN) 5-325 MG per tablet Take 1 tablet by mouth every 6 (six) hours as needed for moderate pain. 20 tablet 0  . MICROGESTIN 1-20 MG-MCG tablet Take 1 tablet by mouth at bedtime.    . topiramate (TOPAMAX) 50 MG tablet Take 50 mg by mouth 2 (two)  times daily.      No current facility-administered medications for this visit.    Allergies as of 12/31/2013 - Review Complete 12/31/2013  Allergen Reaction Noted  . Ciprocinonide [fluocinolone] Hives and Shortness Of Breath 07/20/2013    Vitals: BP 133/88 mmHg  Pulse 123  Ht 5' 5.5" (1.664 m)  Wt 218 lb (98.884 kg)  BMI 35.71 kg/m2  LMP 11/27/2013 Last Weight:  Wt Readings from Last 1 Encounters:  12/31/13 218 lb (98.884 kg)   Last Height:   Ht Readings from Last 1 Encounters:  12/31/13 5' 5.5" (1.664 m)   Physical exam: Exam: Gen: NAD, conversant, well nourised, overweight, well groomed                     CV: Tahycardic,regular, no MRG. No Carotid Bruits. No peripheral edema, warm, nontender Eyes: Conjunctivae clear without exudates or hemorrhage  Neuro: Detailed Neurologic Exam  Speech:    Speech is normal; fluent and spontaneous with normal comprehension.  Cognition:    The patient is oriented to person, place, and time;     recent and remote memory intact;     language fluent;     normal attention, concentration,     fund of knowledge Cranial Nerves:    The pupils are equal, round, and reactive to light. The fundi are normal and spontaneous venous pulsations are present. Visual fields are full to finger confrontation. Extraocular movements are intact. Trigeminal sensation is intact and the muscles of mastication are normal. The face is symmetric. The palate elevates in the midline. Voice is normal. Shoulder shrug is normal. The tongue has normal motion without fasciculations.   Coordination:    Normal finger to nose and heel to shin.   Gait:    Heel-toe and tandem gait are normal.   Motor Observation:    No asymmetry, no atrophy, and no involuntary movements noted. Tone:    Normal muscle tone.    Posture:    Posture is normal. normal erect    Strength:    Giveway right arm and leg.      Sensation: intact to LT     Reflex Exam:  DTR's:    Deep  tendon reflexes in the upper and lower extremities are normal bilaterally.   Toes:    The toes are downgoing bilaterally.   Clonus:    Clonus is absent.       Assessment/Plan:  23 year old female here for evaluation of multiple diffuse complaints. Primary complaint is right-sided weakness and paresthesias since June with acute onset. MRI of the brain without contrast in June was normal per report however I don't have those records and pateint's symptoms are not improved. She also c/o multiple other symptoms such  as dizziness, headachemuscle and joint pain, blurry vision, tingling lips, difficulty with both short and long term memory, word finding difficulty, CP, malaise and feels her neck is going to explode. Was recently in the hospital for CP and general malaise, EKG showed sinus tachycardia with negative PE scan and was discharged from the ED. Previous labs include the following negative or normal; ANA, ESR, RMSF IgG/IgM. CSF workup negative or normal including: cell cnt w diff, lyme antibodies, west nile ab, lyme dna, VZ pcr, MBP, culture, protein, HSV abs, enterovirus pcr, cmv pcr, oligoclonal bands. Suspect significant psychiatric contribution but patient denies.   -Suggested repeating MRI of the brain w/wo contrast.  -Lab work focusing on muscle enzyme activity considering significant reported muscle weakness - EEG to evaluate for epileptiform brain activity - continue lexapro for depression, suggested following up with psychiatry - continue topamax and triptan for headache - integrative therapies for biofeedback, acupuncture, PT, pain/stress management, massage - Suspect significant underlying psychiatrist contributions to symptoms  Addendum Records received 01/11/2014:  [facility] for paresthesias 07/18/13. MRI/MRV of the brain 07/18/2013 with and without: "normal MRi appearance of the brain", "Negative Intracranial MRV" at [facility]. [facility] 11/07/2013 evaluation for numbness/tingling right side of body including face notes state: Patient's brother dies a month before onset of symptoms. MRI/MRA and lumbar puncture negative. Assessment by Starr Lake, MD (rheumatologist) : "No evidence of a connective tissue disease on history nor physical, no evidence of inflammatory arthropathy and no evidence of vasculitis".  Sarina Ill, MD  [facility] [address] [place], [place] [address]  Phone [phone] Fax [phone]

## 2013-12-31 NOTE — Telephone Encounter (Signed)
Received records from [facility] Dr Lucia GaskinsAhern requested 12-31-13.

## 2013-12-31 NOTE — Procedures (Signed)
    History:  Maria Oconnor is a 23 year old patient with a history of episodes of dizziness, and problems with right face numbness spreading to the right side the body. The patient is being evaluated for these events.  This is a routine EEG. No skull defects are noted. Medications include Lexapro, hydrocodone, birth control pills, and Topamax.   EEG classification: Normal awake  Description of the recording: The background rhythms of this recording consists of a fairly well modulated medium amplitude alpha rhythm of 10 Hz that is reactive to eye opening and closure. As the record progresses, the patient appears to remain in the waking state throughout the recording. Photic stimulation was performed, resulting in a bilateral and symmetric photic driving response. Hyperventilation was also performed, resulting in a minimal buildup of the background rhythm activities without significant slowing seen. At no time during the recording does there appear to be evidence of spike or spike wave discharges or evidence of focal slowing. EKG monitor shows no evidence of cardiac rhythm abnormalities with a heart rate of 78.  Impression: This is a normal EEG recording in the waking state. No evidence of ictal or interictal discharges are seen.

## 2013-12-31 NOTE — Patient Instructions (Signed)
Overall you are doing fairly well but I do want to suggest a few things today:   Remember to drink plenty of fluid, eat healthy meals and do not skip any meals. Try to eat protein with a every meal and eat a healthy snack such as fruit or nuts in between meals. Try to keep a regular sleep-wake schedule and try to exercise daily, particularly in the form of walking, 20-30 minutes a day, if you can.   As far as your medications are concerned, I would like to suggest: Continue lexapro, continueTopamax to 50mg  twice a day  As far as diagnostic testing: MRI of the brain and cervical spine   I would like to see you back in 3 months, sooner if we need to. Please call us with any interim questions, concerns, problems, updates or refill requests.   Please also call us for any test results so we can go over those with you on the phone.  My clinical assistant and will answer any of your questions and relay your messages to me and also relay most of my messages to you.   Our phone number is [phone]. We also have an after hours call service for urgent matters and there is a physician on-call for urgent questions. For any emergencies you know to call 911 or go to the nearest emergency room

## 2014-01-01 ENCOUNTER — Telehealth: Payer: Self-pay | Admitting: Internal Medicine

## 2014-01-01 ENCOUNTER — Telehealth: Payer: Self-pay | Admitting: Neurology

## 2014-01-01 LAB — ALDOLASE: ALDOLASE: 7.1 U/L (ref 3.3–10.3)

## 2014-01-01 LAB — CK: CK TOTAL: 65 U/L (ref 24–173)

## 2014-01-01 LAB — LACTIC ACID, PLASMA: Lactate, Ven: 5.9 mg/dL (ref 4.5–19.8)

## 2014-01-01 LAB — MAGNESIUM: MAGNESIUM: 2 mg/dL (ref 1.6–2.6)

## 2014-01-01 LAB — LACTATE DEHYDROGENASE: LDH: 163 IU/L (ref 119–226)

## 2014-01-01 NOTE — Telephone Encounter (Signed)
Patient was calling for an appt.  There was a duplicate referral made from dr.  Patient does not need a return call she has been scheduled for her MRI.

## 2014-01-01 NOTE — Telephone Encounter (Deleted)
Patient returning your phone call--please call patient

## 2014-01-01 NOTE — Telephone Encounter (Signed)
Received records from [facility] [place] ( Dr Shirlean Mylararol Webb) for appointment on 02/01/14 with Dr Rennis GoldenHilty.  [facility] for Dr [facility] Cilty's schedule on 02/01/14.  lp

## 2014-01-01 NOTE — Telephone Encounter (Deleted)
Good morning, not sure if you called this patient because she stated she is returning a call.

## 2014-01-02 ENCOUNTER — Ambulatory Visit (INDEPENDENT_AMBULATORY_CARE_PROVIDER_SITE_OTHER): Payer: BC Managed Care – PPO

## 2014-01-02 ENCOUNTER — Other Ambulatory Visit: Payer: Self-pay | Admitting: Neurology

## 2014-01-02 ENCOUNTER — Telehealth: Payer: Self-pay | Admitting: *Deleted

## 2014-01-02 DIAGNOSIS — M6289 Other specified disorders of muscle: Secondary | ICD-10-CM

## 2014-01-02 DIAGNOSIS — R531 Weakness: Secondary | ICD-10-CM

## 2014-01-02 DIAGNOSIS — R202 Paresthesia of skin: Secondary | ICD-10-CM

## 2014-01-02 MED ORDER — ALPRAZOLAM 0.5 MG PO TABS: ORAL_TABLET | ORAL | Status: DC

## 2014-01-03 MED ORDER — GADOPENTETATE DIMEGLUMINE 469.01 MG/ML IV SOLN: 20.0000 mL | Freq: Once | INTRAVENOUS | Status: AC | PRN

## 2014-01-03 NOTE — Telephone Encounter (Signed)
Called patient to give lab results and patient wants Dr. Lucia GaskinsAhern to give her a call sometime today.

## 2014-01-06 NOTE — Progress Notes (Signed)
Patient ID: Maria Oconnor, female   DOB: 10/19/1990, 23 y.o.   MRN: [id number]   23 yo referred by Dr Justin Mend for atypical chest pain   Seen in ER 11/15 with negative w/u  She has had long standing issues with right sided paresthesias and facial numbness  Sees [facility]  EEG has been normal  And MRI with no definitive abnormality  11/13 CT negative for PE  Noted right sided rib abnormalities  I reviewed this study and aorta/medistinum normal and no coronary artery calcium  Continues to have pains throughout her chest with dyspnea.  Had a 5 day course of prednisone With no relief.  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  Denies excess ETOH or drugs         ROS: Denies fever, malais, weight loss, blurry vision, decreased visual acuity, cough, sputum, SOB, hemoptysis, pleuritic pain, palpitaitons, heartburn, abdominal pain, melena, lower extremity edema, claudication, or rash.  All other systems reviewed and negative   General: Affect appropriate Healthy:  appears stated age 50: normal Neck supple with no adenopathy JVP normal no bruits no thyromegaly Lungs clear with no wheezing and good diaphragmatic motion Heart:  S1/S2 no murmur,rub, gallop or click PMI normal Abdomen: benighn, BS positve, no tenderness, no AAA no bruit.  No HSM or HJR Distal pulses intact with no bruits No edema Neuro non-focal Skin warm and dry No muscular weakness  Medications Current Outpatient Prescriptions  Medication Sig Dispense Refill  . ALPRAZolam (XANAX) 0.5 MG tablet Take one tab 30-60 minutes before MRI. May repeat after 30-60 minutes for anxiety. Lot [id number] 12/2014 3 tablet 0  . escitalopram (LEXAPRO) 10 MG tablet Take 10 mg by mouth at bedtime.    Marland Kitchen HYDROcodone-acetaminophen (NORCO/VICODIN) 5-325 MG per tablet Take 1  tablet by mouth every 6 (six) hours as needed for moderate pain. 20 tablet 0  . MICROGESTIN 1-20 MG-MCG tablet Take 1 tablet by mouth at bedtime.    . topiramate (TOPAMAX) 50 MG tablet Take 50 mg by mouth 2 (two) times daily.      No current facility-administered medications for this visit.    Allergies Ciprocinonide  Family History: Family History  Problem Relation Age of Onset  . Hypertension Mother   . Hypertension Father     Social History: History   Social History  . Marital Status: Married    Spouse Name: N/A    Number of Children: 0  . Years of Education: N/A   Occupational History  . Not on file.   Social History Main Topics  . Smoking status: Never Smoker   . Smokeless tobacco: Never Used  . Alcohol Use: Yes     Comment: occ  . Drug Use: No  . Sexual Activity: Not on file   Other Topics Concern  . Not on file   Social History Narrative   Single with no children   Right handed   Bachelor's degree   2 cups daily    Past Surgical History  Procedure Laterality Date  . Cholecystectomy      Age 28    Past Medical History  Diagnosis Date  . Anxiety   . Right sided weakness   . Headache   . Memory loss   . Neuropathy     Electrocardiogram:  11/14  ST rate 106 otherwise normal ECG  Assessment and Plan

## 2014-01-07 ENCOUNTER — Ambulatory Visit (INDEPENDENT_AMBULATORY_CARE_PROVIDER_SITE_OTHER): Payer: BC Managed Care – PPO | Admitting: Cardiovascular Disease

## 2014-01-07 ENCOUNTER — Telehealth: Payer: Self-pay | Admitting: Neurology

## 2014-01-07 ENCOUNTER — Ambulatory Visit (HOSPITAL_COMMUNITY): Payer: BC Managed Care – PPO | Attending: Cardiovascular Disease

## 2014-01-07 ENCOUNTER — Encounter: Payer: Self-pay | Admitting: *Deleted

## 2014-01-07 VITALS — BP 132/90 | HR 119 | Ht 65.5 in | Wt 214.8 lb

## 2014-01-07 DIAGNOSIS — R000 Tachycardia, unspecified: Secondary | ICD-10-CM

## 2014-01-07 DIAGNOSIS — R42 Dizziness and giddiness: Secondary | ICD-10-CM | POA: Diagnosis not present

## 2014-01-07 DIAGNOSIS — R0600 Dyspnea, unspecified: Secondary | ICD-10-CM

## 2014-01-07 DIAGNOSIS — R9431 Abnormal electrocardiogram [ECG] [EKG]: Secondary | ICD-10-CM | POA: Diagnosis not present

## 2014-01-07 DIAGNOSIS — R072 Precordial pain: Secondary | ICD-10-CM

## 2014-01-07 DIAGNOSIS — R0789 Other chest pain: Secondary | ICD-10-CM

## 2014-01-07 DIAGNOSIS — R079 Chest pain, unspecified: Secondary | ICD-10-CM | POA: Insufficient documentation

## 2014-01-07 HISTORY — DX: Dyspnea, unspecified: R06.00

## 2014-01-07 HISTORY — DX: Tachycardia, unspecified: R00.0

## 2014-01-07 LAB — SEDIMENTATION RATE: Sed Rate: 4 mm/hr (ref 0–22)

## 2014-01-07 LAB — T4, FREE: Free T4: 1.01 ng/dL (ref 0.60–1.60)

## 2014-01-07 LAB — BRAIN NATRIURETIC PEPTIDE: Pro B Natriuretic peptide (BNP): 12 pg/mL (ref 0.0–100.0)

## 2014-01-07 LAB — TSH: TSH: 4.03 u[IU]/mL (ref 0.35–4.50)

## 2014-01-07 NOTE — Patient Instructions (Signed)
Your physician recommends that you schedule a follow-up appointment in: AS NEEDED  Your physician recommends that you continue on your current medications as directed. Please refer to the Current Medication list given to you today. Your physician has requested that you have an echocardiogram. Echocardiography is a painless test that uses sound waves to create images of your heart. It provides your doctor with information about the size and shape of your heart and how well your heart's chambers and valves are working. This procedure takes approximately one hour. There are no restrictions for this procedure.   Your physician recommends that you return for lab work in: TODAY   SED RATE  BNP  TSH   T4

## 2014-01-07 NOTE — Telephone Encounter (Signed)
Patient requesting MRI results.  Please call and advise. °

## 2014-01-07 NOTE — Assessment & Plan Note (Signed)
Etiology unclear This is clearly not coronary pain  No calcium in coronaries on CT.  Doubt it is related to rib abnormality seen on CT Will recheck ESR check TSH and BNP  Echo to r/o pericarditis

## 2014-01-07 NOTE — Assessment & Plan Note (Signed)
Etiology not clear.  Echo for EF.  TSH  Not anemic  Consider trial of beta blocker if EF normal and no effusion No evidence of POTS

## 2014-01-07 NOTE — Progress Notes (Signed)
2D Echo completed. 01/07/2014 

## 2014-01-07 NOTE — Assessment & Plan Note (Signed)
No clear etiology f/u neuro CSF protein negative and MRI negative

## 2014-01-07 NOTE — Assessment & Plan Note (Signed)
Normal exam CT negative for PE  CXR ok  Echo to assess RV/LV function effusion and pulmonary pressures

## 2014-01-08 ENCOUNTER — Telehealth: Payer: Self-pay | Admitting: Cardiovascular Disease

## 2014-01-08 ENCOUNTER — Telehealth: Payer: Self-pay | Admitting: Neurology

## 2014-01-08 NOTE — Telephone Encounter (Signed)
Spoke to patient this morning regarding her imaging results. MRI of the brain w/wo was unremarkable, unchanged from MRI 07/18/2013 (MRI and MRV completed 07/18/2013, both unremarkable). No acute findings on MRI of the brain or any findings to explain symptoms. MRI of the cervical spine w/wo showed mild disk bulging at several levels, no central or foraminal stenosis. Imaging does not reveal etiology of patient's symptoms. Recent lab work normal. Patient acknowledged understanding and had no further questions or concerns. [name] also present for conversation.

## 2014-01-08 NOTE — Telephone Encounter (Signed)
Do you have patient MRI results? Did not see them in the system

## 2014-01-08 NOTE — Telephone Encounter (Signed)
HAVE NOT BEEN  REVIEWED AS OF YET .Zack Seal/CY

## 2014-01-08 NOTE — Telephone Encounter (Signed)
New message ° ° ° °Want ultrasound results °

## 2014-01-08 NOTE — Telephone Encounter (Signed)
We called patient this morning and discussed results. Thank you. See phone note.

## 2014-01-09 NOTE — Telephone Encounter (Signed)
Follow up      Want echo results---pt cannot go back to work until she gets these results

## 2014-01-09 NOTE — Telephone Encounter (Signed)
Maria Oconnor spoke with Maria Oconnor who is aware the echo has not been reviewed yet by the MD.  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  Also she is still feeling tired and SOB with increased HR.  Aware we will forward this information Dr Eden EmmsNishan.

## 2014-01-12 NOTE — Telephone Encounter (Signed)
Echo is normal she can return to work fl

## 2014-01-14 NOTE — Telephone Encounter (Signed)
Follow up      Need echo results-----please call before 12:30---she has a doctor's appt this afternoon

## 2014-01-14 NOTE — Telephone Encounter (Signed)
PT AWARE OF ECHO RESULTS./CY 

## 2014-01-14 NOTE — Telephone Encounter (Signed)
Follow up    Calling back to echo results.

## 2014-02-01 ENCOUNTER — Ambulatory Visit: Payer: BC Managed Care – PPO | Admitting: Internal Medicine

## 2014-04-25 ENCOUNTER — Telehealth: Payer: Self-pay | Admitting: *Deleted

## 2014-04-25 NOTE — Telephone Encounter (Signed)
Records received on patient.

## 2016-05-15 IMAGING — CT CT HEAD WITHOUT CONTRAST
1 series · 16 of 30 positions shown, 20 images · non-contrast
Comparison: None.

CLINICAL DATA: Pt started having right side numbness last night,
worse today with added confusion. no hx of stroke, seizure, or ca

EXAM:
CT HEAD WITHOUT CONTRAST
TECHNIQUE: Contiguous axial images were obtained from the base of the skull
through the vertex without intravenous contrast.

[Series 2: head wo · axial · 0.41mm/px · z∈[-53,+73]mm · 16 of 32 slices shown, 20 images]
[im 2/32  brain]
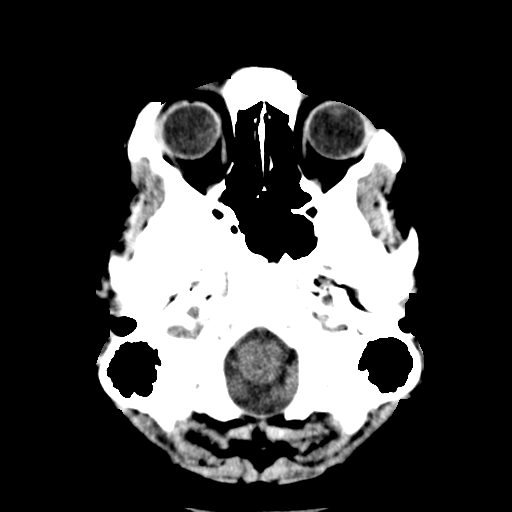
[im 2/32  bone]
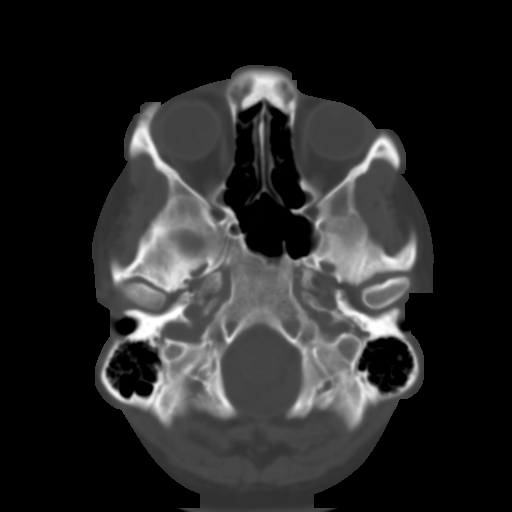
[im 4/32  brain]
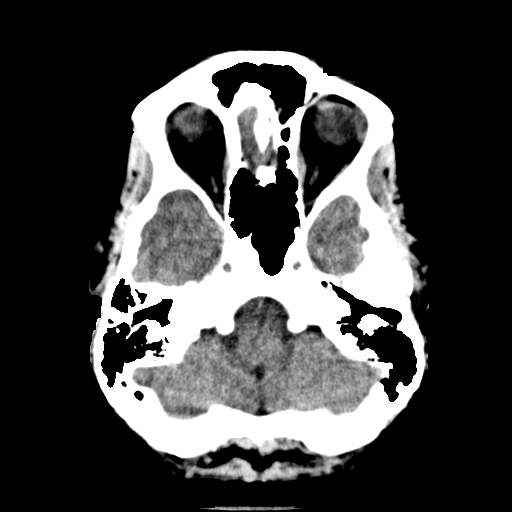
[im 6/32  brain]
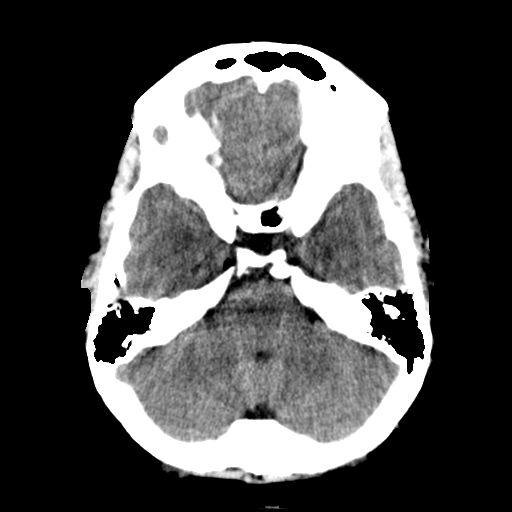
[im 8/32  brain]
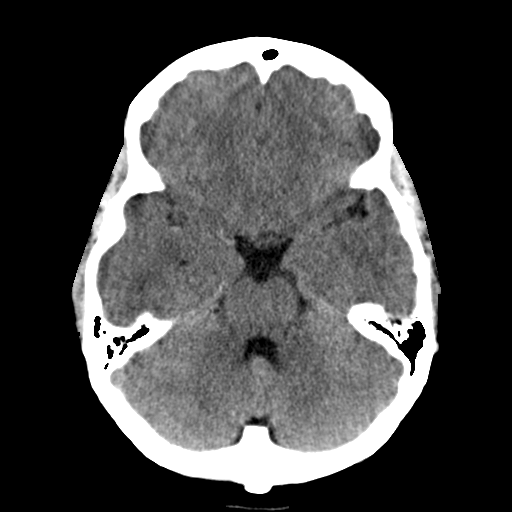
[im 9/32  brain]
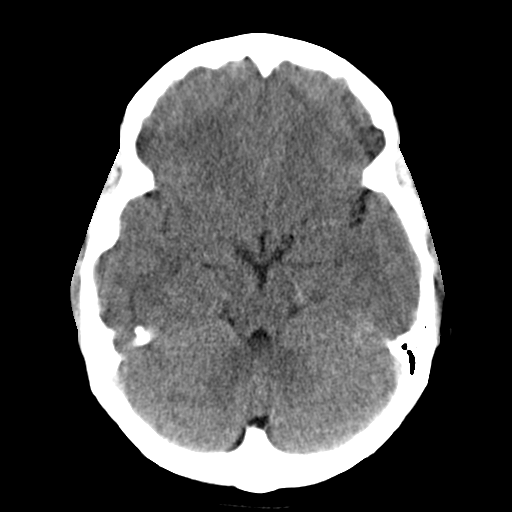
[im 9/32  bone]
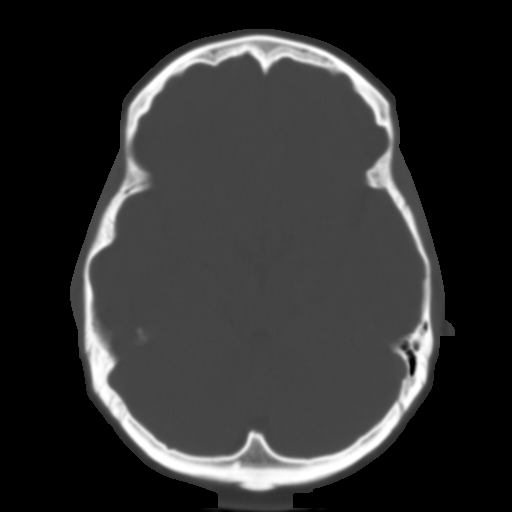
[im 11/32  brain]
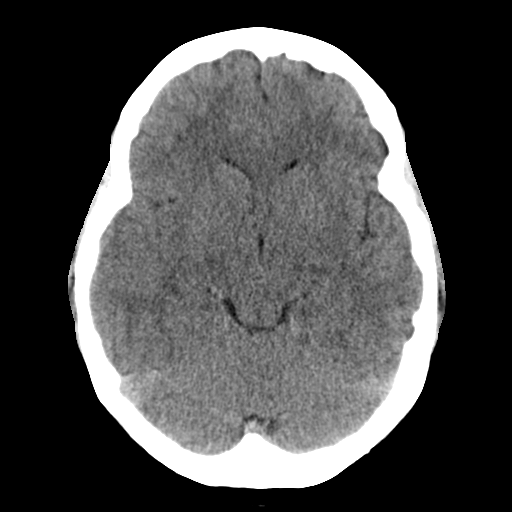
[im 13/32  brain]
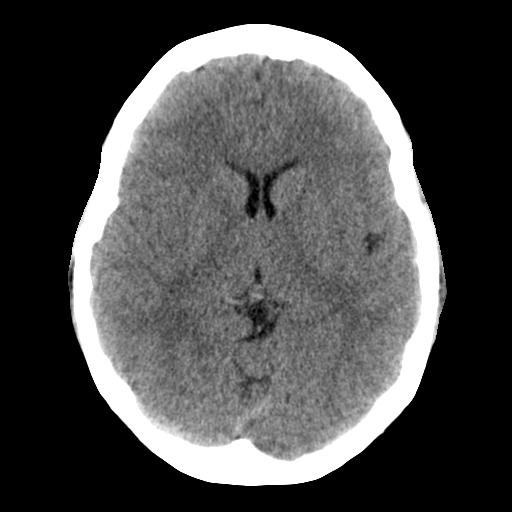
[im 15/32  brain]
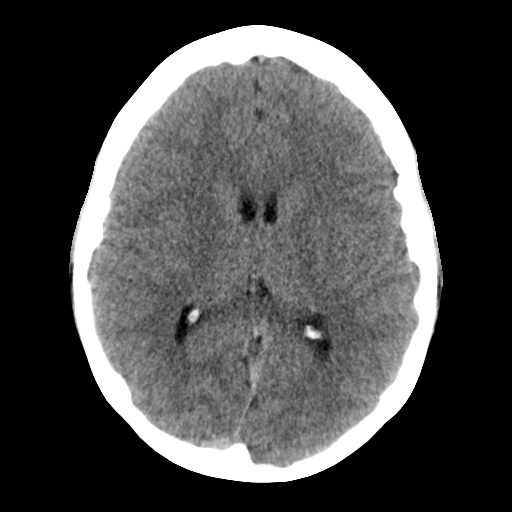
[im 17/32  brain]
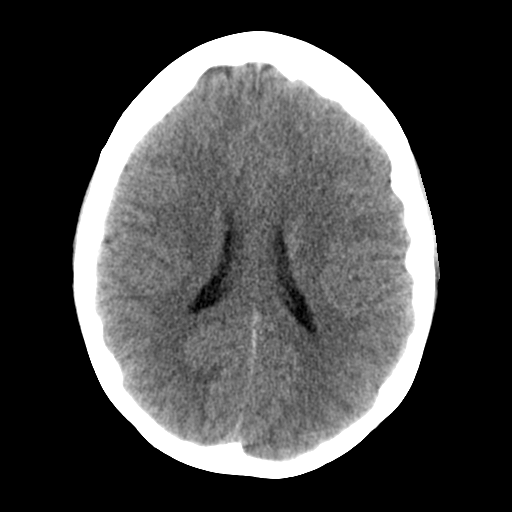
[im 17/32  bone]
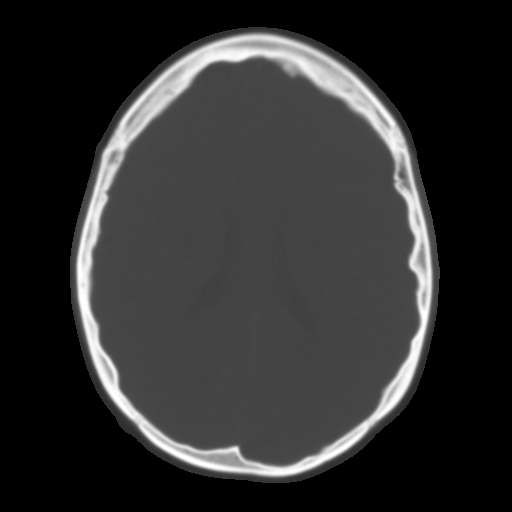
[im 19/32  brain]
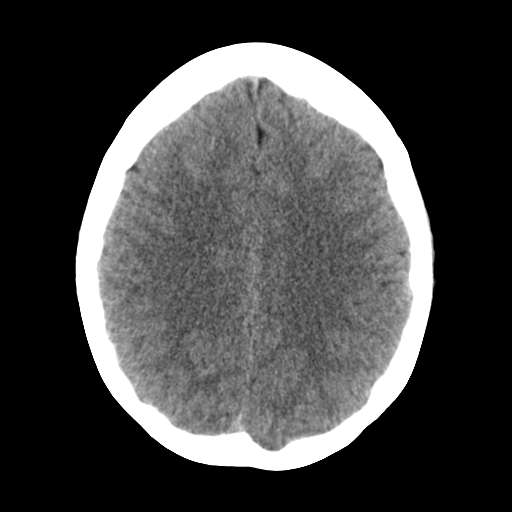
[im 21/32  brain]
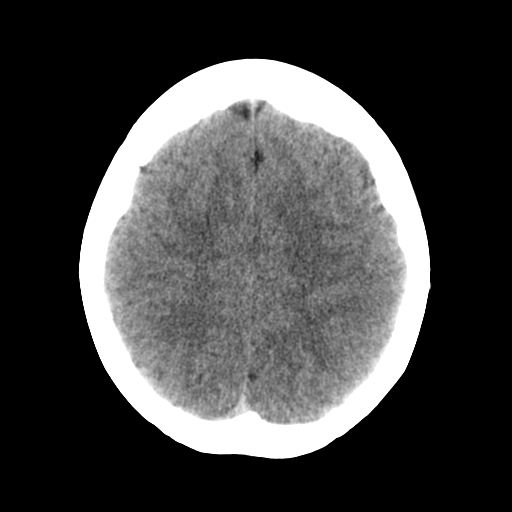
[im 23/32  brain]
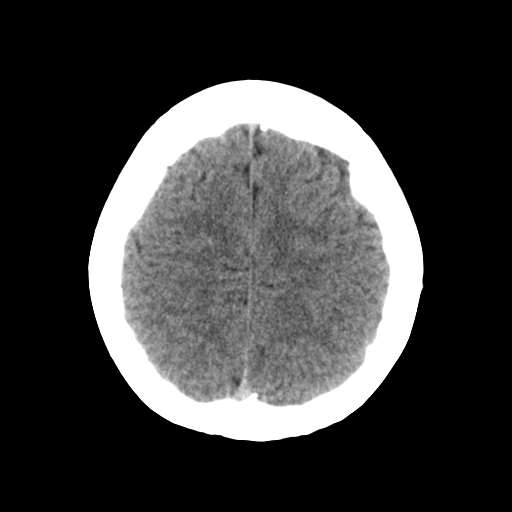
[im 24/32  brain]
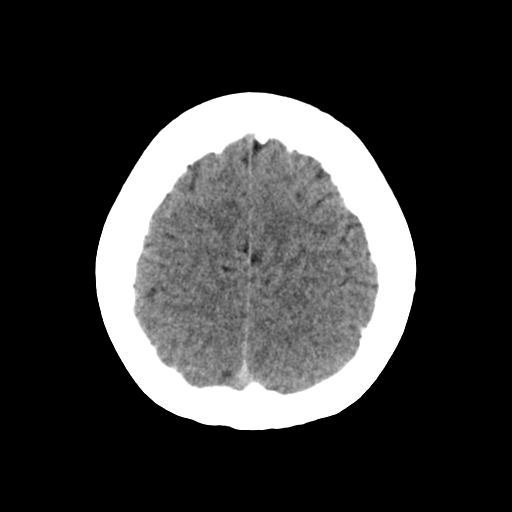
[im 24/32  bone]
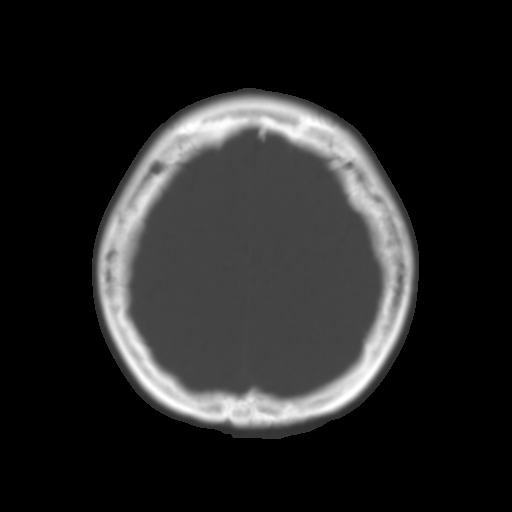
[im 26/32  brain]
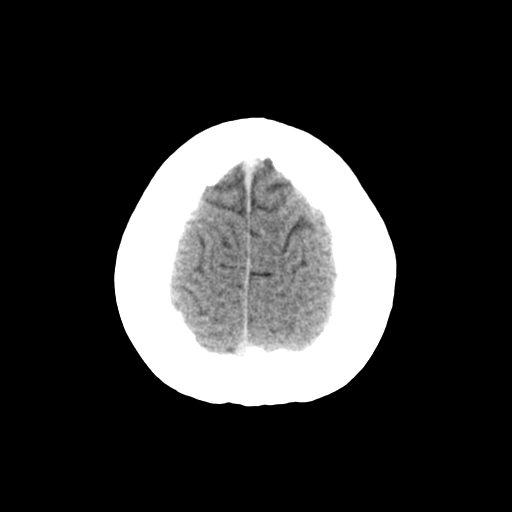
[im 28/32  brain]
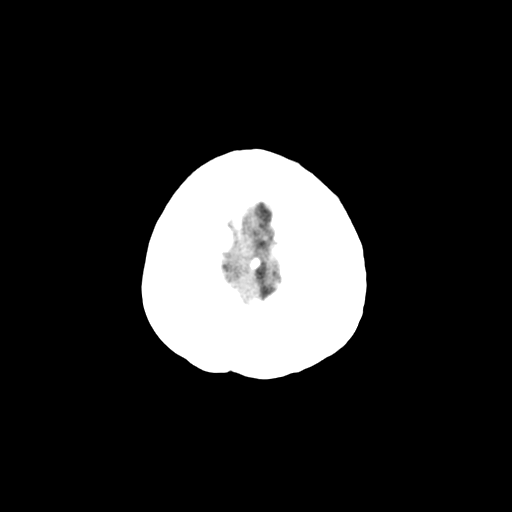
[im 30/32  brain]
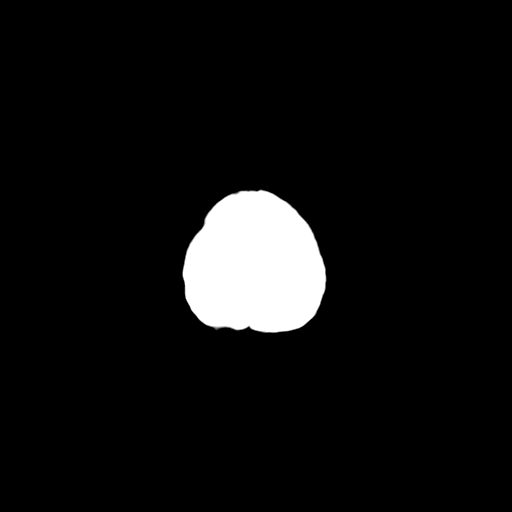

[16 of 30 positions shown; findings below may reference images not displayed]

FINDINGS: No acute intracranial abnormality. Specifically, no hemorrhage,
hydrocephalus, mass lesion, acute infarction, or significant
intracranial injury. No acute calvarial abnormality. The visualized
paranasal sinuses and mastoid air cells are patent.
IMPRESSION: Normal head CT.

## 2016-10-26 IMAGING — CR DG CHEST 2V
2 series · 2 of 2 positions shown · non-contrast
Comparison: 07/10/2004

CLINICAL DATA: Chest pain extending down the right arm. Symptoms
for 5 days. Shortness of breath and nausea today.

EXAM:
CHEST  2 VIEW

[w chest pa]
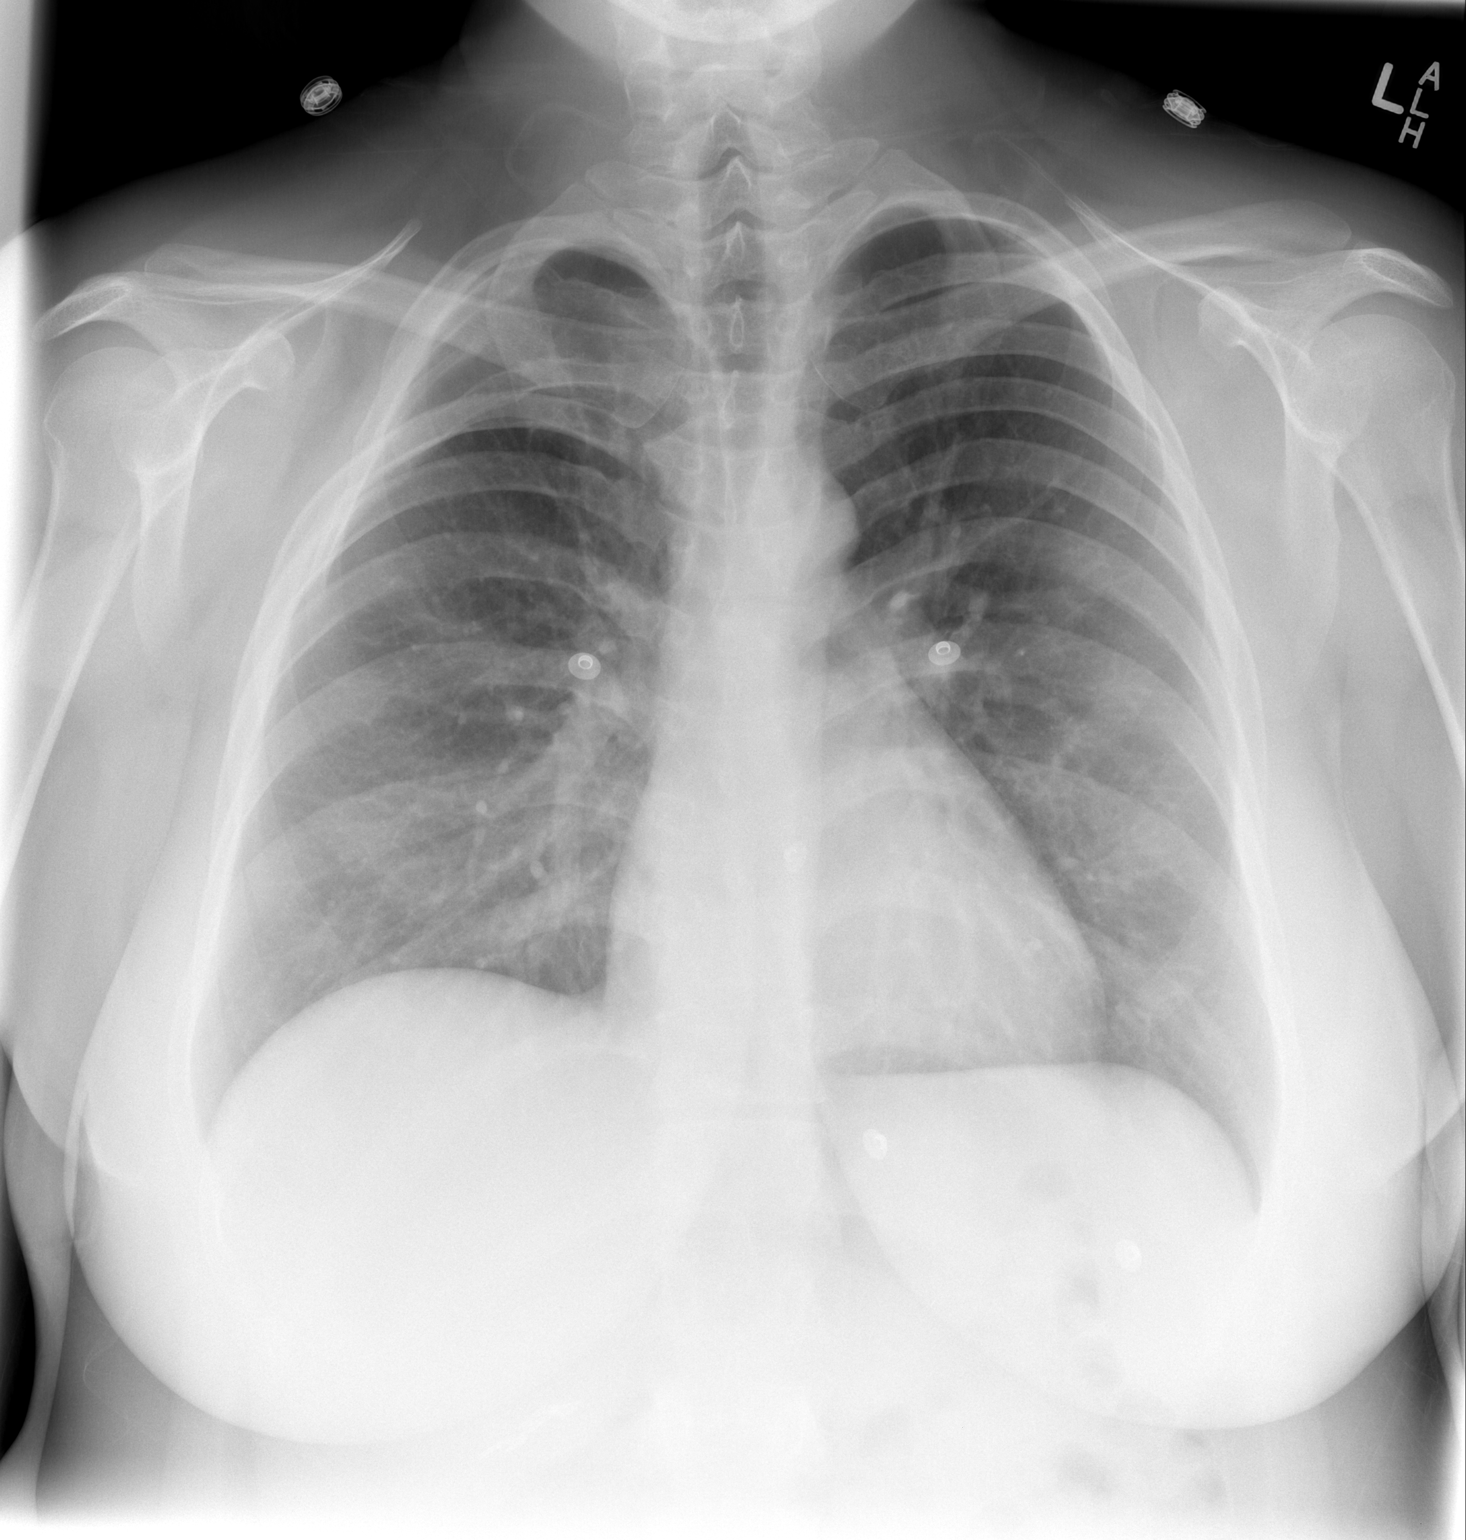

[w chest lat]
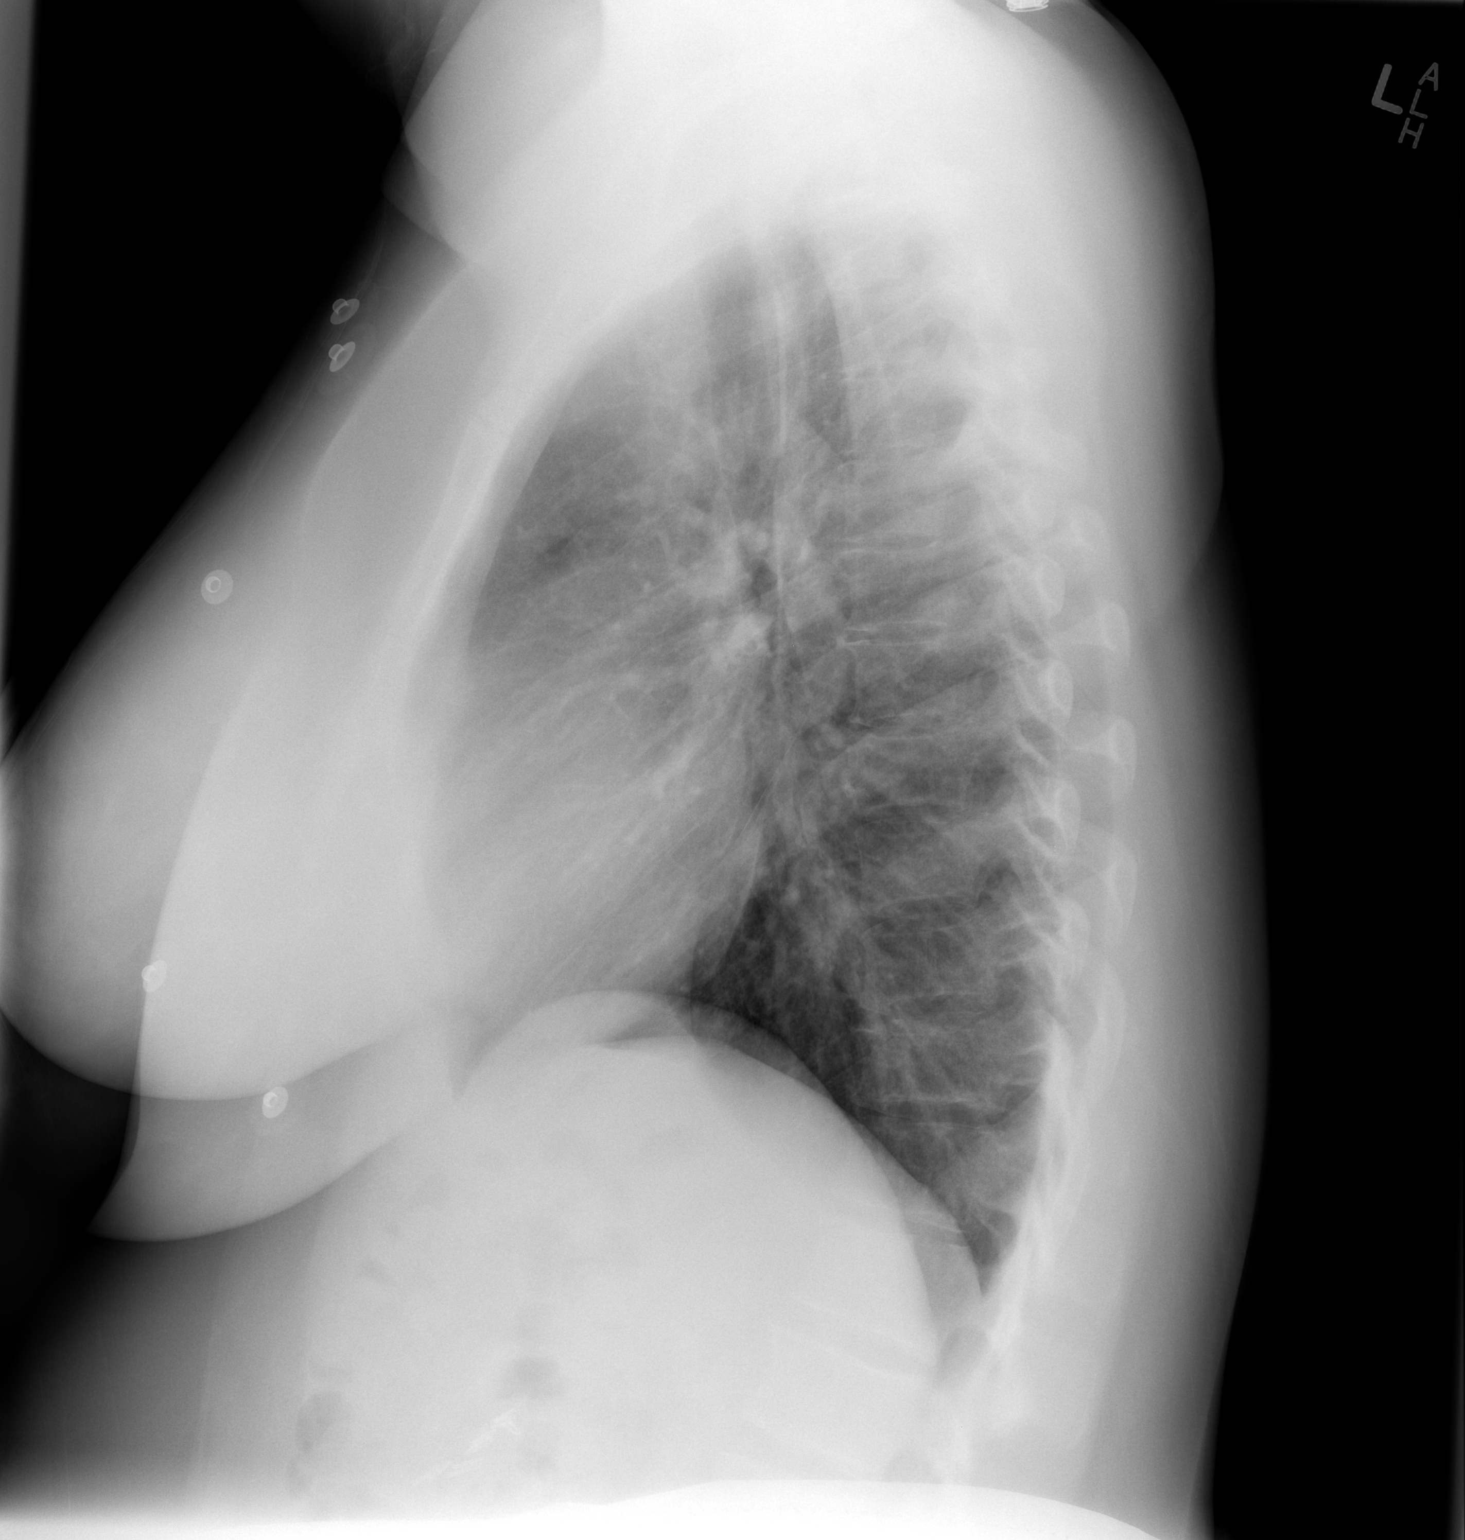

[2 of 2 positions shown; findings below may reference images not displayed]

FINDINGS: Heart size is normal. The lungs are clear. No pulmonary edema.
Surgical clips are present in the right upper quadrant of the
abdomen. Anomalous right upper ribs again noted.
IMPRESSION: No active cardiopulmonary disease.

## 2017-01-20 DIAGNOSIS — F418 Other specified anxiety disorders: Secondary | ICD-10-CM | POA: Insufficient documentation

## 2017-01-20 HISTORY — DX: Other specified anxiety disorders: F41.8

## 2017-02-15 DIAGNOSIS — K200 Eosinophilic esophagitis: Secondary | ICD-10-CM

## 2017-02-15 HISTORY — DX: Eosinophilic esophagitis: K20.0

## 2017-04-09 DIAGNOSIS — J208 Acute bronchitis due to other specified organisms: Secondary | ICD-10-CM | POA: Insufficient documentation

## 2017-04-27 DIAGNOSIS — R1319 Other dysphagia: Secondary | ICD-10-CM | POA: Insufficient documentation

## 2017-04-27 HISTORY — DX: Other dysphagia: R13.19

## 2017-08-11 DIAGNOSIS — K200 Eosinophilic esophagitis: Secondary | ICD-10-CM | POA: Insufficient documentation

## 2022-12-21 LAB — LAB REPORT - SCANNED: EGFR: 60

## 2023-01-19 ENCOUNTER — Emergency Department (HOSPITAL_COMMUNITY): Payer: BC Managed Care – PPO

## 2023-01-19 ENCOUNTER — Encounter: Payer: Self-pay | Admitting: *Deleted

## 2023-01-19 ENCOUNTER — Other Ambulatory Visit: Payer: Self-pay

## 2023-01-19 ENCOUNTER — Emergency Department (HOSPITAL_COMMUNITY)
Admission: EM | Admit: 2023-01-19 | Discharge: 2023-01-20 | Disposition: A | Discharge status: Home / Self Care | Payer: BC Managed Care – PPO | Attending: Emergency Medicine | Admitting: Emergency Medicine

## 2023-01-19 ENCOUNTER — Ambulatory Visit: Payer: BC Managed Care – PPO

## 2023-01-19 ENCOUNTER — Encounter (HOSPITAL_COMMUNITY): Payer: Self-pay | Admitting: Pharmacy Technician

## 2023-01-19 VITALS — BP 128/86 | HR 115 | Ht 65.0 in | Wt 234.6 lb

## 2023-01-19 DIAGNOSIS — M6281 Muscle weakness (generalized): Secondary | ICD-10-CM | POA: Insufficient documentation

## 2023-01-19 DIAGNOSIS — R29898 Other symptoms and signs involving the musculoskeletal system: Secondary | ICD-10-CM

## 2023-01-19 DIAGNOSIS — R202 Paresthesia of skin: Secondary | ICD-10-CM | POA: Insufficient documentation

## 2023-01-19 DIAGNOSIS — R200 Anesthesia of skin: Secondary | ICD-10-CM | POA: Insufficient documentation

## 2023-01-19 DIAGNOSIS — R000 Tachycardia, unspecified: Secondary | ICD-10-CM | POA: Diagnosis not present

## 2023-01-19 LAB — BASIC METABOLIC PANEL
Anion gap: 12 (ref 5–15)
BUN: 9 mg/dL (ref 6–20)
CO2: 18 mmol/L — ABNORMAL LOW (ref 22–32)
Calcium: 9.6 mg/dL (ref 8.9–10.3)
Chloride: 105 mmol/L (ref 98–111)
Creatinine, Ser: 0.71 mg/dL (ref 0.44–1.00)
GFR, Estimated: >60 mL/min (ref >60)
Glucose, Bld: 94 mg/dL (ref 70–99)
Potassium: 3.7 mmol/L (ref 3.5–5.1)
Sodium: 135 mmol/L (ref 135–145)

## 2023-01-19 LAB — CBC WITH DIFFERENTIAL/PLATELET
Abs Immature Granulocytes: 0.03 10*3/uL (ref 0.00–0.07)
Basophils Absolute: 0.1 10*3/uL (ref 0.0–0.1)
Basophils Relative: 1 %
Eosinophils Absolute: 0.2 10*3/uL (ref 0.0–0.5)
Eosinophils Relative: 2 %
HCT: 46 % (ref 36.0–46.0)
Hemoglobin: 15.1 g/dL — ABNORMAL HIGH (ref 12.0–15.0)
Immature Granulocytes: 0 %
Lymphocytes Relative: 20 %
Lymphs Abs: 2.3 10*3/uL (ref 0.7–4.0)
MCH: 31.3 pg (ref 26.0–34.0)
MCHC: 32.8 g/dL (ref 30.0–36.0)
MCV: 95.2 fL (ref 80.0–100.0)
Monocytes Absolute: 0.5 10*3/uL (ref 0.1–1.0)
Monocytes Relative: 5 %
Neutro Abs: 8.3 10*3/uL — ABNORMAL HIGH (ref 1.7–7.7)
Neutrophils Relative %: 72 %
Platelets: 266 10*3/uL (ref 150–400)
RBC: 4.83 MIL/uL (ref 3.87–5.11)
RDW: 13.6 % (ref 11.5–15.5)
WBC: 11.4 10*3/uL — ABNORMAL HIGH (ref 4.0–10.5)
nRBC: 0 % (ref 0.0–0.2)

## 2023-01-19 LAB — TROPONIN I (HIGH SENSITIVITY): Troponin I (High Sensitivity): <2 ng/L (ref <18)

## 2023-01-19 MED ORDER — GADOBUTROL 1 MMOL/ML IV SOLN
10.0000 mL | Freq: Once | INTRAVENOUS | Status: AC | PRN
  Administered 2023-01-19: 10 mL via INTRAVENOUS

## 2023-01-19 NOTE — Patient Instructions (Signed)
Medication Instructions:  Your physician recommends that you continue on your current medications as directed. Please refer to the Current Medication list given to you today.  *If you need a refill on your cardiac medications before your next appointment, please call your pharmacy*   Lab Work: None If you have labs (blood work) drawn today and your tests are completely normal, you will receive your results only by: MyChart Message (if you have MyChart) OR A paper copy in the mail If you have any lab test that is abnormal or we need to change your treatment, we will call you to review the results.   Testing/Procedures: None   Follow-Up: At [facility], you and your health needs are our priority.  As part of our continuing mission to provide you with exceptional heart care, we have created designated Provider Care Teams.  These Care Teams include your primary Cardiologist (physician) and Advanced Practice Providers (APPs -  Physician Assistants and Nurse Practitioners) who all work together to provide you with the care you need, when you need it.  We recommend signing up for the patient portal called "MyChart".  Sign up information is provided on this After Visit Summary.  MyChart is used to connect with patients for Virtual Visits (Telemedicine).  Patients are able to view lab/test results, encounter notes, upcoming appointments, etc.  Non-urgent messages can be sent to your provider as well.   To learn more about what you can do with MyChart, go to [email or web address].    Your next appointment:   Follow up to be determined after ER visit.  Provider:   Huntley Dec, MD    Other Instructions None

## 2023-01-19 NOTE — Assessment & Plan Note (Signed)
Unclear etiology. Motor weakness mostly involving flexor and extensor components of the right elbow and dorsi and plantar flexors of the hand and reduced grip strength.  Associated with numbness and tingling sensation per patient.  Per patient acutely worse over the last 2 days.  Atypical symptoms.  Denies any neck pain.  She had unremarkable MRI of the head on December 2 at [facility].  At this time unclear etiology.  Recommended her to go to the ER with capabilities for neurology prompt evaluation. Anticipate if this is not a central neurological issues needs further evaluation of cervical spine and may also need further evaluation with EMG

## 2023-01-19 NOTE — ED Triage Notes (Signed)
Pt here POV with reports of R arm weakness. States Monday had R sided numbness, went to hospital and MRI was negative. Since then numbness has worsened and now has weakness to R arm. Today developed R sided chest pain.

## 2023-01-19 NOTE — ED Provider Triage Note (Cosign Needed)
Emergency Medicine Provider Triage Evaluation Note  Maria Oconnor , a 32 y.o. female  was evaluated in triage.  Pt complains of right upper extremity numbness, weakness and tingling.  Progressive over the past 3 to 4 days.  Was seen at [facility] and had a negative MRI brain without contrast as well as negative CT PE study.  Symptoms continue to worsen.  Seen by cardiology today who recommended immediate evaluation of the brain and C-spine with potential neurology input.  Review of Systems  Positive: As above Negative: As above  Physical Exam  BP (!) 155/94   Pulse (!) 115   Temp 98.7 F (37.1 C)   Resp 18   SpO2 98%  Gen:   Awake, no distress   Resp:  Normal effort  MSK:   Unable to fully forward flex RUE Other:    Medical Decision Making  Medically screening exam initiated at 6:16 PM.  Appropriate orders placed.  Vito Berger was informed that the remainder of the evaluation will be completed by another provider, this initial triage assessment does not replace that evaluation, and the importance of remaining in the ED until their evaluation is complete.  Workup initiated.  Consulted with Dr. Silverio Lay regarding imaging   Michelle Piper, Cordelia Poche 01/19/23 1901

## 2023-01-19 NOTE — Progress Notes (Signed)
Cardiology Consultation:    Date:  01/19/2023   ID:  Maria Oconnor, DOB 1990-11-16, MRN [id number]  PCP:  Ailene Ravel, MD  Cardiologist:  Luretha Murphy, MD   Referring MD: Street, Stephanie Coup, *   No chief complaint on file.    ASSESSMENT AND PLAN:   Maria Oconnor 32 year old woman with no significant prior cardiac history, has atypical symptoms of right upper extremity weakness associated with slurred sensation going on for the last 2 days. She has had previous cholecystectomy in 2007, diagnosed of eosinophilic esophagitis in 2019  Problem List Items Addressed This Visit     Tachycardia - Primary    Sinus tachycardia at this time. No obvious cardiac symptoms. Evaluate with neurologist as appropriate and follow-up with PCP and see [place] on an as-needed basis.      Relevant Orders   EKG 12-Lead (Completed)   Muscle weakness of right upper extremity    Unclear etiology. Motor weakness mostly involving flexor and extensor components of the right elbow and dorsi and plantar flexors of the hand and reduced grip strength.  Associated with numbness and tingling sensation per patient.  Per patient acutely worse over the last 2 days.  Atypical symptoms.  Denies any neck pain.  She had unremarkable MRI of the head on December 2 at [facility].  At this time unclear etiology.  Recommended her to go to the ER with capabilities for neurology prompt evaluation. Anticipate if this is not a central neurological issues needs further evaluation of cervical spine and may also need further evaluation with EMG      Recommended to go to the ER with neurology capabilities. She is going to call her boyfriend who will drive her to the ER.    History of Present Illness:    Maria Oconnor is a 32 y.o. female who is being seen today for the evaluation of chest pain at the request of Street, Stephanie Coup, *.   She was previously seen by cardiologist Dr.Nishan in  November 2015 for chest pain symptoms that was felt to be noncardiac in nature.  Echocardiogram at the time of her workup noted normal biventricular function LVEF 60 to 65%, no significant valve abnormalities.  History of cholecystectomy in 2007, eosinophilic esophagitis [diagnosed after EGD at [facility] in 2019]  She reports episodes of right sided weakness and numbness involving the upper extremity associated with atypical chest tightness.  An episode occurred in 2015 at which time she had workup with cardiologist.  Over the last few days she started having the symptoms again.  Her PCP referred her to ER at [facility] on 01/17/2023 and she was evaluated with MRI of the brain that was reportedly normal.  Her blood pressure was elevated 175/101 mmHg.  With unremarkable MRI findings on the brain she was discharged to follow-up with neurologist and primary care as outpatient.  She was scheduled to see me today because of the chest pain. She mentions her right upper extremity symptoms have worsened over the last 2 days. In fact in the office today she mentions the pain is associated with numbness and tingling and extending to the right side of the chest feeling like an atypical heavy sensation. She denies any headache, vision changes.  She mentions her brief speech slurring that was present on Monday have resolved.  She denies any palpitations, shortness of breath. Denies any syncopal episodes denies any lightheadedness or dizziness. She describes a rash  over her bilateral lower extremities which occurs intermittently associated with redness and small raised bumps which can be tender.  She has had reportedly extensive workup for multiple sclerosis, strokes and currently being evaluated for rheumatological workup for lupus.  Hemodynamics here at the office visit today stable with blood pressure 128 over 86 mmHg, she remains mildly tachycardic sinus 110s.    EKG in the clinic today  shows sinus tachycardia 115/min, PR interval 172 ms, QRS duration 78 ms.  Normal QTc 456 ms.  Occasional alcohol consumption up to 2 times a week. Non-smoker. No recreational drug use. Works as a Runner, broadcasting/film/video, currently teaches fifth grade.  Current home medications include escitalopram, Microgestin, trazodone, sertraline, Wellbutrin.  Blood work results from 12/20/2022 available to review ANA, ANCA, double-stranded DNA levels unremarkable. ESR elevated at 31 Hemoglobin 14.1, hematocrit 40, WBC 6.7, platelets 290 BUN 7, creatinine 0.8, EGFR greater than 60 Normal transaminases and alkaline phosphatase Lipid panel from 12/06/2022 noted total cholesterol 275, HDL 68, LDL 168, triglycerides 193, suboptimal. Hemoglobin A1c from 12/06/2022 was normal at 5.3  Recent blood work results from [facility] on 01/17/2023 reported BUN 10, creatinine 0.7, sodium 133, potassium 3.8 Hemoglobin 14.6, hematocrit 43.6, platelets 229, WBC 10.2    Past Medical History:  Diagnosis Date   Anxiety    B12 deficiency    Chronic insomnia    Dizziness and giddiness 12/31/2013   Dyspnea 01/07/2014   Esophageal dysphagia 04/27/2017   Added automatically from request for surgery [id number]  Last Assessment & Plan:   Followed by GI     Headache    Memory loss    Mixed anxiety and depressive disorder 01/20/2017   Neuropathy    Paresthesias 07/23/2013   Right sided weakness    Tachycardia 01/07/2014   Vitamin D deficiency     Past Surgical History:  Procedure Laterality Date   CHOLECYSTECTOMY     Age 80    Current Medications: No outpatient medications have been marked as taking for the 01/19/23 encounter (Office Visit) with Amabel Stmarie, Marlyn Corporal, MD.     Allergies:   Ciprocinonide [fluocinolone] and Prednisone   Social History   Socioeconomic History   Marital status: Divorced    Spouse name: Not on file   Number of children: 0   Years of education: Not on file   Highest education level: Not on file   Occupational History   Not on file  Tobacco Use   Smoking status: Never   Smokeless tobacco: Never  Substance and Sexual Activity   Alcohol use: Yes    Alcohol/week: 2.0 standard drinks of alcohol    Types: 2 Standard drinks or equivalent per week   Drug use: No   Sexual activity: Not on file  Other Topics Concern   Not on file  Social History Narrative   Single with no children   Right handed   Bachelor's degree   2 cups daily   Social Determinants of Health   Financial Resource Strain: High Risk (09/10/2020)   Received from [facility] visits prior to 04/17/2022., [facility] visits prior to 04/17/2022.   Overall Financial Resource Strain (CARDIA)    Difficulty of Paying Living Expenses: Hard  Food Insecurity: Low Risk  (07/19/2022)   [facility]   Hunger Vital Sign    Worried About Running Out of Food in the Last Year: Never true    Ran Out of Food in the Last Year: Never  true  Transportation Needs: No Transportation Needs (07/19/2022)   Received from [facility]    In the past 12 months, has lack of reliable transportation kept you from medical appointments, meetings, work or from getting things needed for daily living? : No  Physical Activity: Sufficiently Active (09/10/2020)   Received from [facility] visits prior to 04/17/2022., [facility] visits prior to 04/17/2022.   Exercise Vital Sign    Days of Exercise per Week: 3 days    Minutes of Exercise per Session: 60 min  Stress: No Stress Concern Present (09/10/2020)   Received from [facility] visits prior to 04/17/2022., [facility] visits prior to 04/17/2022.   [employer] of Occupational Health - Occupational Stress Questionnaire    Feeling of Stress : Only a little  Social Connections: Unknown (06/29/2021)   Received from [facility], [facility]   Social  Network    Social Network: Not on file     Family History: The patient's family history includes Heart attack in her mother; Heart block in her paternal uncle; Hypertension in her father and mother; Prostate cancer in her father. ROS:   Please see the history of present illness.    All 14 point review of systems negative except as described per history of present illness.  EKGs/Labs/Other Studies Reviewed:    The following studies were reviewed today:   EKG:       Recent Labs: No results found for requested labs within last 365 days.  Recent Lipid Panel No results found for: "CHOL", "TRIG", "HDL", "CHOLHDL", "VLDL", "LDLCALC", "LDLDIRECT"  Physical Exam:    VS:  BP 128/86   Pulse (!) 115   Ht 5\' 5"  (1.651 m)   Wt 234 lb 9.6 oz (106.4 kg)   SpO2 98%   BMI 39.04 kg/m     Wt Readings from Last 3 Encounters:  01/19/23 234 lb 9.6 oz (106.4 kg)  01/07/14 214 lb 12.8 oz (97.4 kg)  12/31/13 218 lb (98.9 kg)     GENERAL:  Well nourished, well developed in no acute distress NECK: No JVD; No carotid bruits CARDIAC: RRR, S1 and S2 present, no murmurs, no rubs, no gallops CHEST:  Clear to auscultation without rales, wheezing or rhonchi  Extremities: No pitting pedal edema. Pulses bilaterally symmetric with radial 2+ and dorsalis pedis 2+.  Motor strength exam shows right side motor weakness involving flexion of the elbow and extension of the elbow, right hand grip strength. She reports numbness of the right upper extremity.  NEUROLOGIC:  Alert and oriented x 3  Medication Adjustments/Labs and Tests Ordered: Current medicines are reviewed at length with the patient today.  Concerns regarding medicines are outlined above.  Orders Placed This Encounter  Procedures   EKG 12-Lead   No orders of the defined types were placed in this encounter.   Signed, Eryx Zane reddy Nayelly Laughman, MD, MPH, [facility]. 01/19/2023 4:17 PM    [facility]

## 2023-01-19 NOTE — Assessment & Plan Note (Signed)
Sinus tachycardia at this time. No obvious cardiac symptoms. Evaluate with neurologist as appropriate and follow-up with PCP and see [place] on an as-needed basis.

## 2023-01-20 LAB — TROPONIN I (HIGH SENSITIVITY): Troponin I (High Sensitivity): 4 ng/L (ref <18)

## 2023-01-20 NOTE — Progress Notes (Signed)
Office Visit Note  Patient: Maria Oconnor             Date of Birth: 1990/03/04           MRN: [id number]             PCP: [facility] Family Referring: Arabella Merles, PA-C Visit Date: 01/21/2023  Subjective:  New Patient (Initial Visit) (Patient states she feels achey. Patient states she recently had a flare up Wednesday and went to the ER. Patient states she does get stroke like symptoms when she gets a flare. Patient states she did get a referral to a neurologist from the ER Wednesday. )   Discussed the use of AI scribe software for clinical note transcription with the patient, who gave verbal consent to proceed.  History of Present Illness   Maria Oconnor is a 32 y.o. female here for evaluation of episodic neurologic symptoms with numbness and weakness in her face and extremities. She also reports recurrent episodes of skin discoloration and painful rashes, which they have experienced with infrequent episodes for nearly a decade. The rashes, described as red and purple discolorations that feel like bruises, typically precede a flare of their symptoms. The most recent episode occurred in October this year, after a gap since 2015. The rashes, initially confined to the legs, have recently spread to the back and arms, before resolving within a week or two.  In addition to the skin changes, the patient reports episodes of numbness and weakness, particularly on the right side of the body. These episodes, which began a week and a half prior to the consultation, are described as a 'waterfall' sensation down the right side of the face. During these episodes, the patient experiences slurred speech, cognitive fog, and dizziness, with a sensation of the world moving around them. The most recent episode was associated with arm pain and weakness, described as feeling like a blood pressure cuff was on the arm. The patient denies any loss of consciousness or tunnel vision during these  episodes.  The patient has undergone extensive neurological workup in the past, including MRI and lumbar puncture, all of which have been normal. They also report a history of heart ultrasound in 2015, which was unremarkable. The patient denies any history of blood clots or abnormal bleeding. During a recent visit to their primary care provider, the patient had elevated ESR and RF levels, but ANA was negative. The patient has a family history of eczema and kidney disease.   Labs reviewed 12/2022 ANA neg dsDNA, RNP, Sm neg RF 16.9 ESR 31 ANCAs neg  Imaging 01/19/23 MRI Head/Cervical spine W/O Contrast IMPRESSION: 1. Normal MRI of the brain. 2. No spinal cord lesion. 3. Mild cervical degenerative disc disease without spinal canal or neural foraminal stenosis. 4. Bilateral maxillary sinus disease.  2015 Workup MRI/MRA negative LP CSF negative  Activities of Daily Living:  Patient reports morning stiffness for 0 minute.   Patient Denies nocturnal pain.  Difficulty dressing/grooming: Reports Difficulty climbing stairs: Denies Difficulty getting out of chair: Denies Difficulty using hands for taps, buttons, cutlery, and/or writing: Reports  Review of Systems  Constitutional:  Positive for fatigue.  HENT:  Positive for mouth dryness. Negative for mouth sores.   Eyes:  Negative for dryness.  Respiratory:  Negative for shortness of breath.   Cardiovascular:  Positive for chest pain and palpitations.  Gastrointestinal:  Positive for diarrhea. Negative for blood in stool and constipation.  Endocrine: Negative for  increased urination.  Genitourinary:  Negative for involuntary urination.  Musculoskeletal:  Positive for joint pain, gait problem, joint pain, myalgias, muscle weakness and myalgias. Negative for joint swelling, morning stiffness and muscle tenderness.  Skin:  Positive for color change. Negative for rash, hair loss and sensitivity to sunlight.  Allergic/Immunologic: Negative  for susceptible to infections.  Neurological:  Positive for dizziness and headaches.  Hematological:  Negative for swollen glands.  Psychiatric/Behavioral:  Positive for sleep disturbance. Negative for depressed mood. The patient is not nervous/anxious.     PMFS History:  Patient Active Problem List   Diagnosis Date Noted   Rheumatoid factor positive 01/21/2023   Sedimentation rate elevation 01/21/2023   Muscle weakness of right upper extremity 01/19/2023   Eosinophilic esophagitis 08/11/2017   Esophageal dysphagia 04/27/2017   Acute bronchitis due to other specified organisms 04/09/2017   Mixed anxiety and depressive disorder 01/20/2017   Chest pain 01/07/2014   Dyspnea 01/07/2014   Tachycardia 01/07/2014   Depression 12/31/2013   Headache 12/31/2013   Dizziness and giddiness 12/31/2013   Vision changes 12/31/2013   Malaise and fatigue 12/31/2013   Neuropathy 11/07/2013   Paresthesias 07/23/2013    Past Medical History:  Diagnosis Date   Anxiety    B12 deficiency    Chronic insomnia    Dizziness and giddiness 12/31/2013   Dyspnea 01/07/2014   Eosinophilic esophagitis 2019   Esophageal dysphagia 04/27/2017   Added automatically from request for surgery [id number]  Last Assessment & Plan:   Followed by GI     Headache    Memory loss    Mixed anxiety and depressive disorder 01/20/2017   Neuropathy    Paresthesias 07/23/2013   Right sided weakness    Tachycardia 01/07/2014   Vitamin D deficiency     Family History  Problem Relation Age of Onset   Hypertension Mother    Heart attack Mother    Hypertension Father    Prostate cancer Father    Heart block Paternal Uncle    Past Surgical History:  Procedure Laterality Date   CHOLECYSTECTOMY     Age 89   trachesophageal fistula     repair   Social History   Social History Narrative   Single with no children   Right handed   Bachelor's degree   2 cups daily    There is no immunization history on file for this  patient.   Objective: Vital Signs: BP (!) 145/81 (BP Location: Left Arm, Patient Position: Sitting, Cuff Size: Normal)   Pulse 78   Resp 14   Ht 5' 5.25" (1.657 m)   Wt 230 lb (104.3 kg)   BMI 37.98 kg/m    Physical Exam Eyes:     Conjunctiva/sclera: Conjunctivae normal.  Cardiovascular:     Rate and Rhythm: Normal rate and regular rhythm.  Pulmonary:     Effort: Pulmonary effort is normal.     Breath sounds: Normal breath sounds.  Lymphadenopathy:     Cervical: No cervical adenopathy.  Skin:    General: Skin is warm and dry.     Comments: Bruising at previous phlebotomy sites  Neurological:     Mental Status: She is alert.     Comments: Brisk reflexes  Psychiatric:        Mood and Affect: Mood normal.      Musculoskeletal Exam:  Shoulders full ROM no tenderness or swelling Elbows full ROM no tenderness or swelling Wrists full ROM no tenderness or swelling Fingers full  ROM no tenderness or swelling Knees full ROM no tenderness or swelling Ankles full ROM no tenderness or swelling   Investigation: No additional findings.  Imaging: MR BRAIN W WO CONTRAST Result Date: 01/19/2023 CLINICAL DATA:  Right upper extremity numbness and weakness EXAM: MRI HEAD WITHOUT AND WITH CONTRAST MRI CERVICAL SPINE WITHOUT AND WITH CONTRAST TECHNIQUE: Multiplanar, multiecho pulse sequences of the brain and surrounding structures, and cervical spine, to include the craniocervical junction and cervicothoracic junction, were obtained without and with intravenous contrast. CONTRAST:  10mL GADAVIST GADOBUTROL 1 MMOL/ML IV SOLN COMPARISON:  None Available. FINDINGS: MRI HEAD FINDINGS Brain: No acute infarct, mass effect or extra-axial collection. No chronic microhemorrhage or siderosis. Normal white matter signal, parenchymal volume and CSF spaces. The midline structures are normal. There is no abnormal contrast enhancement. Vascular: Normal flow voids. Skull and upper cervical spine: Normal marrow  signal. Sinuses/Orbits: Filling of both maxillary sinuses with retained secretions and/or retention cysts. Other: None. MRI CERVICAL SPINE FINDINGS Alignment: Physiologic. Vertebrae: No fracture, evidence of discitis, or bone lesion. Cord: Normal signal and morphology. Posterior Fossa, vertebral arteries, paraspinal tissues: Negative. Disc levels: C1-2: Unremarkable. C2-3: Normal disc space and facet joints. There is no spinal canal stenosis. No neural foraminal stenosis. C3-4: Small disc bulge narrowing the ventral thecal sac. There is no spinal canal stenosis. No neural foraminal stenosis. C4-5: Normal disc space and facet joints. There is no spinal canal stenosis. No neural foraminal stenosis. C5-6: Small left asymmetric bulge. There is no spinal canal stenosis. No neural foraminal stenosis. C6-7: Normal disc space and facet joints. There is no spinal canal stenosis. No neural foraminal stenosis. C7-T1: Normal disc space and facet joints. There is no spinal canal stenosis. No neural foraminal stenosis. IMPRESSION: 1. Normal MRI of the brain. 2. No spinal cord lesion. 3. Mild cervical degenerative disc disease without spinal canal or neural foraminal stenosis. 4. Bilateral maxillary sinus disease. Electronically Signed   By: Deatra Robinson M.D.   On: 01/19/2023 21:48   MR Cervical Spine W and Wo Contrast Result Date: 01/19/2023 CLINICAL DATA:  Right upper extremity numbness and weakness EXAM: MRI HEAD WITHOUT AND WITH CONTRAST MRI CERVICAL SPINE WITHOUT AND WITH CONTRAST TECHNIQUE: Multiplanar, multiecho pulse sequences of the brain and surrounding structures, and cervical spine, to include the craniocervical junction and cervicothoracic junction, were obtained without and with intravenous contrast. CONTRAST:  10mL GADAVIST GADOBUTROL 1 MMOL/ML IV SOLN COMPARISON:  None Available. FINDINGS: MRI HEAD FINDINGS Brain: No acute infarct, mass effect or extra-axial collection. No chronic microhemorrhage or siderosis.  Normal white matter signal, parenchymal volume and CSF spaces. The midline structures are normal. There is no abnormal contrast enhancement. Vascular: Normal flow voids. Skull and upper cervical spine: Normal marrow signal. Sinuses/Orbits: Filling of both maxillary sinuses with retained secretions and/or retention cysts. Other: None. MRI CERVICAL SPINE FINDINGS Alignment: Physiologic. Vertebrae: No fracture, evidence of discitis, or bone lesion. Cord: Normal signal and morphology. Posterior Fossa, vertebral arteries, paraspinal tissues: Negative. Disc levels: C1-2: Unremarkable. C2-3: Normal disc space and facet joints. There is no spinal canal stenosis. No neural foraminal stenosis. C3-4: Small disc bulge narrowing the ventral thecal sac. There is no spinal canal stenosis. No neural foraminal stenosis. C4-5: Normal disc space and facet joints. There is no spinal canal stenosis. No neural foraminal stenosis. C5-6: Small left asymmetric bulge. There is no spinal canal stenosis. No neural foraminal stenosis. C6-7: Normal disc space and facet joints. There is no spinal canal stenosis. No neural foraminal stenosis.  C7-T1: Normal disc space and facet joints. There is no spinal canal stenosis. No neural foraminal stenosis. IMPRESSION: 1. Normal MRI of the brain. 2. No spinal cord lesion. 3. Mild cervical degenerative disc disease without spinal canal or neural foraminal stenosis. 4. Bilateral maxillary sinus disease. Electronically Signed   By: Deatra Robinson M.D.   On: 01/19/2023 21:48    Recent Labs: Lab Results  Component Value Date   WBC 11.4 (H) 01/19/2023   HGB 15.1 (H) 01/19/2023   PLT 266 01/19/2023   NA 135 01/19/2023   K 3.7 01/19/2023   CL 105 01/19/2023   CO2 18 (L) 01/19/2023   GLUCOSE 94 01/19/2023   BUN 9 01/19/2023   CREATININE 0.71 01/19/2023   BILITOT 0.5 07/18/2013   ALKPHOS 60 07/18/2013   AST 19 07/18/2013   ALT 20 07/18/2013   PROT 7.4 01/21/2023   ALBUMIN 4.1 07/18/2013    CALCIUM 9.6 01/19/2023   GFRAA >90 12/28/2013    Speciality Comments: No specialty comments available.  Procedures:  No procedures performed Allergies: Ciprocinonide [fluocinolone] and Prednisone   Assessment / Plan:     Visit Diagnoses: Rheumatoid factor positive - Plan: Cyclic citrul peptide antibody, IgG, Sedimentation rate, C-reactive protein, C3 and C4, Lupus Anticoagulant Eval w/Reflex, Cardiolipin antibodies, IgG, IgM, IgA, Beta-2 glycoprotein antibodies, IgG, IgA, IgM, Serum protein electrophoresis with reflex  Recurrent Neurological Symptoms Patient reports episodic symptoms including numbness, weakness, slurred speech, and dizziness. Symptoms are often preceded by skin discolorations and rashes. Previous workup including MRI and lumbar puncture were unremarkable. -Order additional blood work as detailed above evaluating for autoimmune/inflammatory causes -Has appointment with neurology coming up -Consider referral to occupational or physical therapy to manage current symptoms.  Skin Discolorations/Rashes Patient reports recurrent skin discolorations and rashes, often preceding neurological symptoms. Difficult to coordinate evaluation due to episodic nature -Consider dermatology referral if symptoms recur or worsen.   Orders: Orders Placed This Encounter  Procedures   Cyclic citrul peptide antibody, IgG   Sedimentation rate   C-reactive protein   C3 and C4   Lupus Anticoagulant Eval w/Reflex   Cardiolipin antibodies, IgG, IgM, IgA   Beta-2 glycoprotein antibodies   IgG, IgA, IgM   Serum protein electrophoresis with reflex   No orders of the defined types were placed in this encounter.    Follow-Up Instructions: Return if symptoms worsen or fail to improve.   Fuller Plan, MD  Note - This record has been created using [employer].  Chart creation errors have been sought, but may not always  have been located. Such creation errors do not reflect on   the standard of medical care.

## 2023-01-20 NOTE — Discharge Instructions (Addendum)
It was our pleasure to provide your ER care today - we hope that you feel better.  Overall, your ED labs and imaging results are reassuring.   For recent symptoms, we recommend that you follow up closely with a neurologist - we have made referral, and they should be contacting you with an appointment.  If you have not heard from the office by tomorrow afternoon, call the attached office/number to arrange appointment.   We also would recommend close and regular follow up with a primary care doctor who can help arrange and coordinate your medical care.   Return to ER if worse, new symptoms, fevers, new/severe pain, one-sided numbness and weakness, change in speech or vision, or other concern.

## 2023-01-20 NOTE — ED Provider Notes (Signed)
[facility] EMERGENCY DEPARTMENT AT [facility] Provider Note   CSN: [id number] Arrival date & time: 01/19/23  1707     History  No chief complaint on file.   Maria Oconnor is a 32 y.o. female.  Pt presented indicating right arm numbness/weakness in past two days. Notes recent eval at [facility] ER for similar symptoms within past week and reports MRI and CT PE study normal.  Pt indicates followed up with cardiology and when mentioned symptoms was referred back to ED.  No headache. No change in vision or speech. No falls or problems w balance. No syncope. No fevers.   The history is provided by the patient.       Home Medications Prior to Admission medications   Medication Sig Start Date End Date Taking? Authorizing Provider  BLISOVI FE 1/20 1-20 MG-MCG tablet Take 1 tablet by mouth daily.    [provider]  buPROPion (WELLBUTRIN XL) 300 MG 24 hr tablet Take 300 mg by mouth daily. 01/11/22   [provider]  cyclobenzaprine (FLEXERIL) 10 MG tablet Take 10 mg by mouth 2 (two) times daily as needed for muscle spasms. 03/23/21   [provider]  diclofenac (VOLTAREN) 75 MG EC tablet Take 75 mg by mouth 2 (two) times daily as needed for mild pain (pain score 1-3) or moderate pain (pain score 4-6). 01/23/21   [provider]  sertraline (ZOLOFT) 100 MG tablet Take 2 tablets by mouth daily. 05/13/20   [provider]  traZODone (DESYREL) 50 MG tablet Take 1 tablet by mouth at bedtime. 11/09/22   [provider]      Allergies    Ciprocinonide [fluocinolone] and Prednisone    Review of Systems   Review of Systems  Constitutional:  Negative for fever.  HENT:  Negative for trouble swallowing.   Eyes:  Negative for pain, redness and visual disturbance.  Respiratory:  Negative for shortness of breath.   Cardiovascular:  Negative for chest pain.  Gastrointestinal:  Negative for abdominal pain, nausea and vomiting.   Genitourinary:  Negative for flank pain.  Musculoskeletal:  Negative for back pain and neck pain.  Skin:  Negative for rash.  Neurological:  Positive for weakness and numbness. Negative for speech difficulty and headaches.  Hematological:  Does not bruise/bleed easily.  Psychiatric/Behavioral:  Negative for confusion.     Physical Exam Updated Vital Signs BP 133/77   Pulse 88   Temp 98.4 F (36.9 C) (Oral)   Resp 18   SpO2 97%  Physical Exam Vitals and nursing note reviewed.  Constitutional:      Appearance: Normal appearance. She is well-developed.  HENT:     Head: Atraumatic.     Nose: Nose normal.     Mouth/Throat:     Mouth: Mucous membranes are moist.  Eyes:     General: No scleral icterus.    Extraocular Movements: Extraocular movements intact.     Conjunctiva/sclera: Conjunctivae normal.     Pupils: Pupils are equal, round, and reactive to light.  Neck:     Vascular: No carotid bruit.     Trachea: No tracheal deviation.  Cardiovascular:     Rate and Rhythm: Normal rate and regular rhythm.     Pulses: Normal pulses.     Heart sounds: Normal heart sounds. No murmur heard.    No friction rub. No gallop.  Pulmonary:     Effort: Pulmonary effort is normal. No respiratory distress.  Breath sounds: Normal breath sounds.  Abdominal:     General: There is no distension.     Palpations: Abdomen is soft.     Tenderness: There is no abdominal tenderness.  Musculoskeletal:        General: No swelling or tenderness.     Cervical back: Normal range of motion and neck supple. No rigidity. No muscular tenderness.     Right lower leg: No edema.     Left lower leg: No edema.     Comments: Good rom bilateral upper extremities without pain. No upper extremity swelling. Radial pulse palp, equal, bil.   Skin:    General: Skin is warm and dry.     Findings: No rash.  Neurological:     General: No focal deficit present.     Mental Status: She is alert and oriented to  person, place, and time.     Cranial Nerves: No cranial nerve deficit.     Comments: Alert, speech normal. Motor/sens grossly intact bil, stre 5/5 bil, no pronator drift (note some inconsistencies to exam with initial normal strength and normal observed function/use of extremities, but then with apparent give way weakness w left arm). Sensation grossly intact. Steady gait.   Psychiatric:        Mood and Affect: Mood normal.     ED Results / Procedures / Treatments   Labs (all labs ordered are listed, but only abnormal results are displayed) Results for orders placed or performed during the hospital encounter of 01/19/23  Basic metabolic panel  Result Value Ref Range   Sodium 135 135 - 145 mmol/L   Potassium 3.7 3.5 - 5.1 mmol/L   Chloride 105 98 - 111 mmol/L   CO2 18 (L) 22 - 32 mmol/L   Glucose, Bld 94 70 - 99 mg/dL   BUN 9 6 - 20 mg/dL   Creatinine, Ser 1.61 0.44 - 1.00 mg/dL   Calcium 9.6 8.9 - 09.6 mg/dL   GFR, Estimated >04 >54 mL/min   Anion gap 12 5 - 15  CBC with Differential  Result Value Ref Range   WBC 11.4 (H) 4.0 - 10.5 K/uL   RBC 4.83 3.87 - 5.11 MIL/uL   Hemoglobin 15.1 (H) 12.0 - 15.0 g/dL   HCT 09.8 11.9 - 14.7 %   MCV 95.2 80.0 - 100.0 fL   MCH 31.3 26.0 - 34.0 pg   MCHC 32.8 30.0 - 36.0 g/dL   RDW 82.9 56.2 - 13.0 %   Platelets 266 150 - 400 K/uL   nRBC 0.0 0.0 - 0.2 %   Neutrophils Relative % 72 %   Neutro Abs 8.3 (H) 1.7 - 7.7 K/uL   Lymphocytes Relative 20 %   Lymphs Abs 2.3 0.7 - 4.0 K/uL   Monocytes Relative 5 %   Monocytes Absolute 0.5 0.1 - 1.0 K/uL   Eosinophils Relative 2 %   Eosinophils Absolute 0.2 0.0 - 0.5 K/uL   Basophils Relative 1 %   Basophils Absolute 0.1 0.0 - 0.1 K/uL   Immature Granulocytes 0 %   Abs Immature Granulocytes 0.03 0.00 - 0.07 K/uL  Troponin I (High Sensitivity)  Result Value Ref Range   Troponin I (High Sensitivity) <2 <18 ng/L  Troponin I (High Sensitivity)  Result Value Ref Range   Troponin I (High  Sensitivity) 4 <18 ng/L   MR BRAIN W WO CONTRAST  Result Date: 01/19/2023 CLINICAL DATA:  Right upper extremity numbness and weakness EXAM: MRI HEAD WITHOUT AND  WITH CONTRAST MRI CERVICAL SPINE WITHOUT AND WITH CONTRAST TECHNIQUE: Multiplanar, multiecho pulse sequences of the brain and surrounding structures, and cervical spine, to include the craniocervical junction and cervicothoracic junction, were obtained without and with intravenous contrast. CONTRAST:  10mL GADAVIST GADOBUTROL 1 MMOL/ML IV SOLN COMPARISON:  None Available. FINDINGS: MRI HEAD FINDINGS Brain: No acute infarct, mass effect or extra-axial collection. No chronic microhemorrhage or siderosis. Normal white matter signal, parenchymal volume and CSF spaces. The midline structures are normal. There is no abnormal contrast enhancement. Vascular: Normal flow voids. Skull and upper cervical spine: Normal marrow signal. Sinuses/Orbits: Filling of both maxillary sinuses with retained secretions and/or retention cysts. Other: None. MRI CERVICAL SPINE FINDINGS Alignment: Physiologic. Vertebrae: No fracture, evidence of discitis, or bone lesion. Cord: Normal signal and morphology. Posterior Fossa, vertebral arteries, paraspinal tissues: Negative. Disc levels: C1-2: Unremarkable. C2-3: Normal disc space and facet joints. There is no spinal canal stenosis. No neural foraminal stenosis. C3-4: Small disc bulge narrowing the ventral thecal sac. There is no spinal canal stenosis. No neural foraminal stenosis. C4-5: Normal disc space and facet joints. There is no spinal canal stenosis. No neural foraminal stenosis. C5-6: Small left asymmetric bulge. There is no spinal canal stenosis. No neural foraminal stenosis. C6-7: Normal disc space and facet joints. There is no spinal canal stenosis. No neural foraminal stenosis. C7-T1: Normal disc space and facet joints. There is no spinal canal stenosis. No neural foraminal stenosis. IMPRESSION: 1. Normal MRI of the brain.  2. No spinal cord lesion. 3. Mild cervical degenerative disc disease without spinal canal or neural foraminal stenosis. 4. Bilateral maxillary sinus disease. Electronically Signed   By: Deatra Robinson M.D.   On: 01/19/2023 21:48   MR Cervical Spine W and Wo Contrast  Result Date: 01/19/2023 CLINICAL DATA:  Right upper extremity numbness and weakness EXAM: MRI HEAD WITHOUT AND WITH CONTRAST MRI CERVICAL SPINE WITHOUT AND WITH CONTRAST TECHNIQUE: Multiplanar, multiecho pulse sequences of the brain and surrounding structures, and cervical spine, to include the craniocervical junction and cervicothoracic junction, were obtained without and with intravenous contrast. CONTRAST:  10mL GADAVIST GADOBUTROL 1 MMOL/ML IV SOLN COMPARISON:  None Available. FINDINGS: MRI HEAD FINDINGS Brain: No acute infarct, mass effect or extra-axial collection. No chronic microhemorrhage or siderosis. Normal white matter signal, parenchymal volume and CSF spaces. The midline structures are normal. There is no abnormal contrast enhancement. Vascular: Normal flow voids. Skull and upper cervical spine: Normal marrow signal. Sinuses/Orbits: Filling of both maxillary sinuses with retained secretions and/or retention cysts. Other: None. MRI CERVICAL SPINE FINDINGS Alignment: Physiologic. Vertebrae: No fracture, evidence of discitis, or bone lesion. Cord: Normal signal and morphology. Posterior Fossa, vertebral arteries, paraspinal tissues: Negative. Disc levels: C1-2: Unremarkable. C2-3: Normal disc space and facet joints. There is no spinal canal stenosis. No neural foraminal stenosis. C3-4: Small disc bulge narrowing the ventral thecal sac. There is no spinal canal stenosis. No neural foraminal stenosis. C4-5: Normal disc space and facet joints. There is no spinal canal stenosis. No neural foraminal stenosis. C5-6: Small left asymmetric bulge. There is no spinal canal stenosis. No neural foraminal stenosis. C6-7: Normal disc space and facet  joints. There is no spinal canal stenosis. No neural foraminal stenosis. C7-T1: Normal disc space and facet joints. There is no spinal canal stenosis. No neural foraminal stenosis. IMPRESSION: 1. Normal MRI of the brain. 2. No spinal cord lesion. 3. Mild cervical degenerative disc disease without spinal canal or neural foraminal stenosis. 4. Bilateral maxillary sinus disease. Electronically  Signed   By: Deatra Robinson M.D.   On: 01/19/2023 21:48     EKG EKG Interpretation Date/Time:  Wednesday January 19 2023 17:26:22 EST Ventricular Rate:  122 PR Interval:  162 QRS Duration:  76 QT Interval:  324 QTC Calculation: 461 R Axis:   102  Text Interpretation: Sinus tachycardia Rightward axis When compared with ECG of 19-Jan-2023 16:21, PREVIOUS ECG IS PRESENT Confirmed by Alvester Chou [phone]) on 01/20/2023 6:38:07 AM  Radiology MR BRAIN W WO CONTRAST  Result Date: 01/19/2023 CLINICAL DATA:  Right upper extremity numbness and weakness EXAM: MRI HEAD WITHOUT AND WITH CONTRAST MRI CERVICAL SPINE WITHOUT AND WITH CONTRAST TECHNIQUE: Multiplanar, multiecho pulse sequences of the brain and surrounding structures, and cervical spine, to include the craniocervical junction and cervicothoracic junction, were obtained without and with intravenous contrast. CONTRAST:  10mL GADAVIST GADOBUTROL 1 MMOL/ML IV SOLN COMPARISON:  None Available. FINDINGS: MRI HEAD FINDINGS Brain: No acute infarct, mass effect or extra-axial collection. No chronic microhemorrhage or siderosis. Normal white matter signal, parenchymal volume and CSF spaces. The midline structures are normal. There is no abnormal contrast enhancement. Vascular: Normal flow voids. Skull and upper cervical spine: Normal marrow signal. Sinuses/Orbits: Filling of both maxillary sinuses with retained secretions and/or retention cysts. Other: None. MRI CERVICAL SPINE FINDINGS Alignment: Physiologic. Vertebrae: No fracture, evidence of discitis, or bone lesion.  Cord: Normal signal and morphology. Posterior Fossa, vertebral arteries, paraspinal tissues: Negative. Disc levels: C1-2: Unremarkable. C2-3: Normal disc space and facet joints. There is no spinal canal stenosis. No neural foraminal stenosis. C3-4: Small disc bulge narrowing the ventral thecal sac. There is no spinal canal stenosis. No neural foraminal stenosis. C4-5: Normal disc space and facet joints. There is no spinal canal stenosis. No neural foraminal stenosis. C5-6: Small left asymmetric bulge. There is no spinal canal stenosis. No neural foraminal stenosis. C6-7: Normal disc space and facet joints. There is no spinal canal stenosis. No neural foraminal stenosis. C7-T1: Normal disc space and facet joints. There is no spinal canal stenosis. No neural foraminal stenosis. IMPRESSION: 1. Normal MRI of the brain. 2. No spinal cord lesion. 3. Mild cervical degenerative disc disease without spinal canal or neural foraminal stenosis. 4. Bilateral maxillary sinus disease. Electronically Signed   By: Deatra Robinson M.D.   On: 01/19/2023 21:48   MR Cervical Spine W and Wo Contrast  Result Date: 01/19/2023 CLINICAL DATA:  Right upper extremity numbness and weakness EXAM: MRI HEAD WITHOUT AND WITH CONTRAST MRI CERVICAL SPINE WITHOUT AND WITH CONTRAST TECHNIQUE: Multiplanar, multiecho pulse sequences of the brain and surrounding structures, and cervical spine, to include the craniocervical junction and cervicothoracic junction, were obtained without and with intravenous contrast. CONTRAST:  10mL GADAVIST GADOBUTROL 1 MMOL/ML IV SOLN COMPARISON:  None Available. FINDINGS: MRI HEAD FINDINGS Brain: No acute infarct, mass effect or extra-axial collection. No chronic microhemorrhage or siderosis. Normal white matter signal, parenchymal volume and CSF spaces. The midline structures are normal. There is no abnormal contrast enhancement. Vascular: Normal flow voids. Skull and upper cervical spine: Normal marrow signal.  Sinuses/Orbits: Filling of both maxillary sinuses with retained secretions and/or retention cysts. Other: None. MRI CERVICAL SPINE FINDINGS Alignment: Physiologic. Vertebrae: No fracture, evidence of discitis, or bone lesion. Cord: Normal signal and morphology. Posterior Fossa, vertebral arteries, paraspinal tissues: Negative. Disc levels: C1-2: Unremarkable. C2-3: Normal disc space and facet joints. There is no spinal canal stenosis. No neural foraminal stenosis. C3-4: Small disc bulge narrowing the ventral thecal sac. There is no  spinal canal stenosis. No neural foraminal stenosis. C4-5: Normal disc space and facet joints. There is no spinal canal stenosis. No neural foraminal stenosis. C5-6: Small left asymmetric bulge. There is no spinal canal stenosis. No neural foraminal stenosis. C6-7: Normal disc space and facet joints. There is no spinal canal stenosis. No neural foraminal stenosis. C7-T1: Normal disc space and facet joints. There is no spinal canal stenosis. No neural foraminal stenosis. IMPRESSION: 1. Normal MRI of the brain. 2. No spinal cord lesion. 3. Mild cervical degenerative disc disease without spinal canal or neural foraminal stenosis. 4. Bilateral maxillary sinus disease. Electronically Signed   By: Deatra Robinson M.D.   On: 01/19/2023 21:48    Procedures Procedures    Medications Ordered in ED Medications  gadobutrol (GADAVIST) 1 MMOL/ML injection 10 mL (10 mLs Intravenous Contrast Given 01/19/23 2026)    ED Course/ Medical Decision Making/ A&P                                 Medical Decision Making Problems Addressed: Left arm numbness: acute illness or injury Numbness and tingling of left side of face: acute illness or injury Right arm numbness: acute illness or injury with systemic symptoms that poses a threat to life or bodily functions Right arm weakness: acute illness or injury with systemic symptoms that poses a threat to life or bodily functions  Amount and/or  Complexity of Data Reviewed External Data Reviewed: notes. Labs: ordered. Decision-making details documented in ED Course. Radiology: ordered and independent interpretation performed. Decision-making details documented in ED Course. ECG/medicine tests: ordered and independent interpretation performed. Decision-making details documented in ED Course.  Risk Decision regarding hospitalization.   Labs ordered/sent. Imaging ordered.   Differential diagnosis includes cva, other acute neurologic process, functional symptoms, etc. Dispo decision including potential need for admission considered - will get labs and imaging and reassess.   Reviewed nursing notes and prior charts for additional history. External reports reviewed.   Labs reviewed/interpreted by me - wbc 11, hct normal. Na/k normal, bun/cr normal.   MRI reviewed/interpreted by me - no hem or acute cva.   Pts presented with right arm numbness/weakness. Pt observed using both extremities normally. When informed of 'good news' of MRIs being normal, pt indicates right arm now better (after reporting two days of constant symptoms), but w left face and arm numbness/tingling. No facial weakness or droop. No focal deficits on exam.   ED workup reassuring. Vital signs normal.   Pt had requested neurology consultation - neurology was paged/consulted. While waiting for neurology call back, was informed by RN that patient had exited ED prior to completion eval/neuro eval and recheck.  (Pt left ED prior to completion of ED eval, neuro consult, without  EDP notification prior to patient having left). Will make referral for outpatient neuro follow up.            Final Clinical Impression(s) / ED Diagnoses Final diagnoses:  None    Rx / DC Orders ED Discharge Orders     None         Cathren Laine, MD 01/20/23 [phone]

## 2023-01-20 NOTE — ED Notes (Signed)
Pt left AMA, MD notified, pt ambulatory upon discharge

## 2023-01-21 ENCOUNTER — Encounter: Payer: Self-pay | Admitting: Internal Medicine

## 2023-01-21 ENCOUNTER — Ambulatory Visit: Payer: BC Managed Care – PPO | Attending: Internal Medicine | Admitting: Internal Medicine

## 2023-01-21 VITALS — BP 145/81 | HR 78 | Resp 14 | Ht 65.25 in | Wt 230.0 lb

## 2023-01-21 DIAGNOSIS — R768 Other specified abnormal immunological findings in serum: Secondary | ICD-10-CM

## 2023-01-21 DIAGNOSIS — R700 Elevated erythrocyte sedimentation rate: Secondary | ICD-10-CM | POA: Diagnosis not present

## 2023-01-21 DIAGNOSIS — R202 Paresthesia of skin: Secondary | ICD-10-CM | POA: Diagnosis not present

## 2023-01-21 NOTE — Patient Instructions (Addendum)
I am checking the sedimentation rate C-reactive protein and complements which are indicators of active inflammation.  I am checking for the lupus anticoagulant, beta-2 glycoprotein, and anticardiolipin antibodies these are associated with abnormal clotting function.  I am checking for the CCP antibodies and quantitative immunoglobulins for a pattern of other abnormal antibodies in the blood test especially ones that may cause the low positive rheumatoid factor result.

## 2023-01-25 ENCOUNTER — Encounter: Payer: Self-pay | Admitting: Neurology

## 2023-01-25 ENCOUNTER — Ambulatory Visit: Payer: BC Managed Care – PPO | Admitting: Neurology

## 2023-01-25 VITALS — BP 138/90 | HR 88 | Ht 65.0 in | Wt 233.0 lb

## 2023-01-25 DIAGNOSIS — R2990 Unspecified symptoms and signs involving the nervous system: Secondary | ICD-10-CM

## 2023-01-25 DIAGNOSIS — G459 Transient cerebral ischemic attack, unspecified: Secondary | ICD-10-CM

## 2023-01-25 DIAGNOSIS — R079 Chest pain, unspecified: Secondary | ICD-10-CM

## 2023-01-25 DIAGNOSIS — G8191 Hemiplegia, unspecified affecting right dominant side: Secondary | ICD-10-CM

## 2023-01-25 DIAGNOSIS — I498 Other specified cardiac arrhythmias: Secondary | ICD-10-CM

## 2023-01-25 DIAGNOSIS — R200 Anesthesia of skin: Secondary | ICD-10-CM

## 2023-01-25 MED ORDER — NURTEC 75 MG PO TBDP: ORAL_TABLET | ORAL | Status: AC

## 2023-01-25 NOTE — Progress Notes (Addendum)
[facility]    Provider:  Dr Lucia Gaskins Requesting Provider: Cathren Laine, MD Primary Care Provider:  Physicians, [facility]  CC: episodic weakness  HPI:  Maria Oconnor is a 32 y.o. female here as requested by Cathren Laine, MD for right-sided neurologic deficits. has Paresthesias; Depression; Headache; Dizziness and giddiness; Vision changes; Malaise and fatigue; Chest pain; Dyspnea; Tachycardia; Acute bronchitis due to other specified organisms; Esophageal dysphagia; Eosinophilic esophagitis; Neuropathy; Mixed anxiety and depressive disorder; Muscle weakness of right upper extremity; Rheumatoid factor positive; and Sedimentation rate elevation on their problem list.  I reviewed emergency room notes.  Patient presented with right arm numbness weakness for 2 days at the beginning of this month just a few days ago, she was also seen at [facility] for similar symptoms within the past week, no headache, no change in vision or speech, no falls or problems with balance, no syncope, no fevers, I reviewed the emergency room notes which showed she had good range of motion in the bilateral upper extremities without pain, pulses normal, no upper extremity swelling, neurologically motor and sensory were normal strength is 5 out of 5 bilaterally no pronator drift but they did see some inconsistencies to exam.  She was observed using the arms normally, when informed of MRIs being normal patient indicated right arm was better and she reported left face and arm numbness tingling.  Neuro was paged but patient left the ED prior to completion of neuro eval.  Patient is here, she says in 2015 had the same right-sided weakness event the same but this last time it was worse just a few days ago. The start of October  this year she started getting inflammation of the fatty tissue, she saw rheumatology and they are working her up crp was elevated at 9  andshe is having joint pain, fatigue,  brain fog and that's why she saw rhaumatology as part of the workup. 2 weeks ago she had "stroke-like symptoms" no pain, she had a wave of numbness starting at the head and then spreads to the right face, right arm and leg, her brain starts going crazy and words don;t come out. Last Monday it happened again but this time her arm hurt underneath the arm the fatty part almost like a blood pressure cuff is on her and it hurts and is numb an dthen she bcame very dizzy which were new symptoms, she couldn;t drive home and again this wasdifferent from prior because this time had pain and dizziness and went to [place] [facility] and then [facility]. Had imaging at [facility]. She wasn't feeling better and saw cardiology and he sent to [place] and MRI brain and cervical spine normal so referred her oupatient to neurology.  Time line:  In 2015 patient had episodes of right-sided numbness face and right hemivbody, couldn't think, couldn;t speak. Had some mild right-sided weakness. No etiology found In the beginning of October 2024 had joint pain and fatigue and brain fog and inflammation  2 weeks ago started having the numbness right side of the body again, no pain didn't address again A week ago started having worse episode of right-sided numbness but also new symptoms of right arm pain, feeling dizzy, lightheaded, weakness of the right side has subsequently followed with rheumatology and cardiology No migraines, no headaches, no hx of seizure  She also had heaviness in the chest with her episode 11/5/20224 and has fluttering feels in her heart   Reviewed notes, labs and imaging from  outside physicians, which showed:   Imaging: 01/18/2021; MRI brain and cervical spine:  IMPRESSION: 1. Normal MRI of the brain. 2. No spinal cord lesion. 3. Mild cervical degenerative disc disease without spinal canal or neural foraminal stenosis. 4. Bilateral maxillary sinus disease.  Recent Results (from the past 2160 hour(s))  Basic  metabolic panel     Status: Abnormal   Collection Time: 01/19/23  6:20 PM  Result Value Ref Range   Sodium 135 135 - 145 mmol/L   Potassium 3.7 3.5 - 5.1 mmol/L   Chloride 105 98 - 111 mmol/L   CO2 18 (L) 22 - 32 mmol/L   Glucose, Bld 94 70 - 99 mg/dL    Comment: Glucose reference range applies only to samples taken after fasting for at least 8 hours.   BUN 9 6 - 20 mg/dL   Creatinine, Ser 1.61 0.44 - 1.00 mg/dL   Calcium 9.6 8.9 - 09.6 mg/dL   GFR, Estimated >04 >54 mL/min    Comment: (NOTE) Calculated using the CKD-EPI Creatinine Equation (2021)    Anion gap 12 5 - 15    Comment: Performed at [facility], [address] [place], [place] [address]  CBC with Differential     Status: Abnormal   Collection Time: 01/19/23  6:20 PM  Result Value Ref Range   WBC 11.4 (H) 4.0 - 10.5 K/uL   RBC 4.83 3.87 - 5.11 MIL/uL   Hemoglobin 15.1 (H) 12.0 - 15.0 g/dL   HCT 91.4 78.2 - 95.6 %   MCV 95.2 80.0 - 100.0 fL   MCH 31.3 26.0 - 34.0 pg   MCHC 32.8 30.0 - 36.0 g/dL   RDW 21.3 08.6 - 57.8 %   Platelets 266 150 - 400 K/uL   nRBC 0.0 0.0 - 0.2 %   Neutrophils Relative % 72 %   Neutro Abs 8.3 (H) 1.7 - 7.7 K/uL   Lymphocytes Relative 20 %   Lymphs Abs 2.3 0.7 - 4.0 K/uL   Monocytes Relative 5 %   Monocytes Absolute 0.5 0.1 - 1.0 K/uL   Eosinophils Relative 2 %   Eosinophils Absolute 0.2 0.0 - 0.5 K/uL   Basophils Relative 1 %   Basophils Absolute 0.1 0.0 - 0.1 K/uL   Immature Granulocytes 0 %   Abs Immature Granulocytes 0.03 0.00 - 0.07 K/uL    Comment: Performed at [facility], [address] [place], [place] [address]  Troponin I (High Sensitivity)     Status: None   Collection Time: 01/19/23  6:20 PM  Result Value Ref Range   Troponin I (High Sensitivity) <2 <18 ng/L    Comment: (NOTE) Elevated high sensitivity troponin I (hsTnI) values and significant  changes across serial measurements may suggest ACS but many other  chronic and acute conditions are known to  elevate hsTnI results.  Refer to the "Links" section for chest pain algorithms and additional  guidance. Performed at [facility], [address], [place], [place] [address]   Troponin I (High Sensitivity)     Status: None   Collection Time: 01/19/23 11:36 PM  Result Value Ref Range   Troponin I (High Sensitivity) 4 <18 ng/L    Comment: (NOTE) Elevated high sensitivity troponin I (hsTnI) values and significant  changes across serial measurements may suggest ACS but many other  chronic and acute conditions are known to elevate hsTnI results.  Refer to the "Links" section for chest pain algorithms and additional  guidance. Performed at [facility], [address] [place], [place] [address]   Sedimentation rate     Status: None   Collection Time: 01/21/23  9:23 AM  Result Value Ref Range   Sed Rate 14 0 - 20 mm/h  C-reactive protein     Status: Abnormal   Collection Time: 01/21/23  9:23 AM  Result Value Ref Range   CRP 9.0 (H) <8.0 mg/L  C3 and C4     Status: None   Collection Time: 01/21/23  9:23 AM  Result Value Ref Range   C3 Complement 170 83 - 193 mg/dL   C4 Complement 28 15 - 57 mg/dL  Lupus Anticoagulant Eval w/Reflex     Status: None   Collection Time: 01/21/23  9:23 AM  Result Value Ref Range   Lupus Anticoagulant see note     Comment: A Lupus Anticoagulant is not detected. Marland Kitchen Reference Range:  Not Detected . For additional information, please refer to http://education.questdiagnostics.com/faq/FAQ01v2 . (This link is being provided for informational/ educational purposes only.) . Marland Kitchen This interpretation is based on the following test results. Marland Kitchen    PTT-LA Screen 27 <=40 sec   dRVVT 34 <=45 sec  IgG, IgA, IgM     Status: None   Collection Time: 01/21/23  9:23 AM  Result Value Ref Range   Immunoglobulin A 236 47 - 310 mg/dL   IgG (Immunoglobin G), Serum 1,025 600 - 1,640 mg/dL   IgM, Serum 96 50 - 604 mg/dL  Serum protein electrophoresis with  reflex     Status: None   Collection Time: 01/21/23  9:23 AM  Result Value Ref Range   Total Protein 7.4 6.1 - 8.1 g/dL   Albumin ELP 4.5 3.8 - 4.8 g/dL   Alpha 1 0.3 0.2 - 0.3 g/dL   Alpha 2 0.8 0.5 - 0.9 g/dL   Beta Globulin 0.5 0.4 - 0.6 g/dL   Beta 2 0.4 0.2 - 0.5 g/dL   Gamma Globulin 0.9 0.8 - 1.7 g/dL   SPE Interp.      Comment: Normal Serum Protein Electrophoresis Pattern. No abnormal protein bands (M-protein) detected.         Review of Systems: Patient complains of symptoms per HPI as well as the following symptoms joint pain, fatigue, swelling. Pertinent negatives and positives per HPI. All others negative.   Social History   Socioeconomic History   Marital status: Divorced    Spouse name: Not on file   Number of children: 0   Years of education: Not on file   Highest education level: Not on file  Occupational History   Occupation: teacher  Tobacco Use   Smoking status: Never    Passive exposure: Past   Smokeless tobacco: Never  Vaping Use   Vaping status: Never Used  Substance and Sexual Activity   Alcohol use: Not Currently    Comment: socially/holidays   Drug use: No   Sexual activity: Not on file  Other Topics Concern   Not on file  Social History Narrative   Single with no children   Right handed   Bachelor's degree   2 cups daily   Social Determinants of Health   Financial Resource Strain: High Risk (09/10/2020)   Received from [facility] visits prior to 04/17/2022., [facility] visits prior to 04/17/2022.   Overall Financial Resource Strain (CARDIA)    Difficulty of Paying Living Expenses: Hard  Food Insecurity: Low  Risk  (07/19/2022)   [facility]   Hunger Vital Sign    Worried About Running Out of Food in the Last Year: Never true    Ran Out of Food in the Last Year: Never true  Transportation Needs: No Transportation Needs (07/19/2022)   Received from [employer]    In the past 12 months, has lack of reliable transportation kept you from medical appointments, meetings, work or from getting things needed for daily living? : No  Physical Activity: Sufficiently Active (09/10/2020)   Received [facility] visits prior to 04/17/2022., [facility] visits prior to 04/17/2022.   Exercise Vital Sign    Days of Exercise per Week: 3 days    Minutes of Exercise per Session: 60 min  Stress: No Stress Concern Present (09/10/2020)   Received from [facility] visits prior to 04/17/2022., [facility] visits prior to 04/17/2022.   [employer] of Occupational Health - Occupational Stress Questionnaire    Feeling of Stress : Only a little  Social Connections: Unknown (06/29/2021)   Received from [facility], [facility]   Social Network    Social Network: Not on file  Intimate Partner Violence: Unknown (05/21/2021)   Received from [facility], [facility]   HITS    Physically Hurt: Not on file    Insult or Talk Down To: Not on file    Threaten Physical Harm: Not on file    Scream or Curse: Not on file    Family History  Problem Relation Age of Onset   Hypertension Mother    Heart attack Mother    Hypertension Father    Prostate cancer Father    Heart block Paternal Uncle     Past Medical History:  Diagnosis Date   Anxiety    B12 deficiency    Chronic insomnia    Dizziness and giddiness 12/31/2013   Dyspnea 01/07/2014   Eosinophilic esophagitis 2019   Esophageal dysphagia 04/27/2017   Added automatically from request for surgery [id number]  Last Assessment & Plan:   Followed by GI     Headache    Memory loss    Mixed anxiety and depressive disorder 01/20/2017   Neuropathy    Paresthesias 07/23/2013   Right sided weakness    Tachycardia 01/07/2014   Vitamin D deficiency     Patient Active Problem List   Diagnosis Date Noted   Rheumatoid  factor positive 01/21/2023   Sedimentation rate elevation 01/21/2023   Muscle weakness of right upper extremity 01/19/2023   Eosinophilic esophagitis 08/11/2017   Esophageal dysphagia 04/27/2017   Acute bronchitis due to other specified organisms 04/09/2017   Mixed anxiety and depressive disorder 01/20/2017   Chest pain 01/07/2014   Dyspnea 01/07/2014   Tachycardia 01/07/2014   Depression 12/31/2013   Headache 12/31/2013   Dizziness and giddiness 12/31/2013   Vision changes 12/31/2013   Malaise and fatigue 12/31/2013   Neuropathy 11/07/2013   Paresthesias 07/23/2013    Past Surgical History:  Procedure Laterality Date   CHOLECYSTECTOMY     Age 36   trachesophageal fistula     repair    Current Outpatient Medications  Medication Sig Dispense Refill   BLISOVI FE 1/20 1-20 MG-MCG tablet Take 1 tablet by mouth daily.     buPROPion (WELLBUTRIN XL) 300 MG 24 hr tablet Take 300 mg by mouth daily.     Rimegepant  Sulfate (NURTEC) 75 MG TBDP Take one daily for the next 8 days     sertraline (ZOLOFT) 100 MG tablet Take 2 tablets by mouth daily.     traZODone (DESYREL) 50 MG tablet Take 1 tablet by mouth at bedtime.     No current facility-administered medications for this visit.    Allergies as of 01/25/2023 - Review Complete 01/25/2023  Allergen Reaction Noted   Ciprocinonide [fluocinolone] Hives and Shortness Of Breath 07/20/2013   Prednisone Hives, Rash, and Other (See Comments) 05/11/2017    Vitals: BP (!) 138/90   Pulse 88   Ht 5\' 5"  (1.651 m)   Wt 233 lb (105.7 kg)   BMI 38.77 kg/m  Last Weight:  Wt Readings from Last 1 Encounters:  01/25/23 233 lb (105.7 kg)   Last Height:   Ht Readings from Last 1 Encounters:  01/25/23 5\' 5"  (1.651 m)     Physical exam: Exam: Gen: NAD, conversant, well nourised, obese, well groomed                     CV: RRR, no MRG. No Carotid Bruits. No peripheral edema, warm, nontender Eyes: Conjunctivae clear without exudates or  hemorrhage  Neuro: Detailed Neurologic Exam  Speech:    Speech is normal; fluent and spontaneous with normal comprehension.  Cognition:    The patient is oriented to person, place, and time;     recent and remote memory intact;     language fluent;     normal attention, concentration,     fund of knowledge Cranial Nerves:    The pupils are equal, round, and reactive to light. The fundi are normal and spontaneous venous pulsations are present. Visual fields are full to finger confrontation. Extraocular movements are intact. Trigeminal sensation is intact and the muscles of mastication are normal. The face is symmetric. The palate elevates in the midline. Hearing intact. Voice is normal. Shoulder shrug is normal. The tongue has normal motion without fasciculations.   Coordination: nml  Gait: nml  Motor Observation:    No asymmetry, no atrophy, and no involuntary movements noted. Tone:    Normal muscle tone.    Posture:    Posture is normal. normal erect    Strength: right arm generalized weakness 4/5 otherwise strength is V/V in the upper and lower limbs.      Sensation: decreased right face and arm (leg improved)     Reflex Exam:  DTR's:    Deep tendon reflexes in the upper and lower extremities are normal bilaterally.   Toes:    The toes are downgoing bilaterally.   Clonus:    Clonus is absent.   Weakness right arm and numbness right face arm (leg improved) triptans contraindicated.   Assessment/Plan:   Maria Oconnor is a 32 y.o. female here as requested by Cathren Laine, MD for right-sided neurologic deficits. has Paresthesias; Depression; Headache; Dizziness and giddiness; Vision changes; Malaise and fatigue; Chest pain; Dyspnea; Tachycardia; Acute bronchitis due to other specified organisms; Esophageal dysphagia; Eosinophilic esophagitis; Neuropathy; Mixed anxiety and depressive disorder; Muscle weakness of right upper extremity; Rheumatoid factor positive; and  Sedimentation rate elevation on their problem list. Subjective weakness with normal imaging, normal exams, possibly nonphysiologic per emergency room given inconsistent examination findings (per my review of their notes) howevere cannot rule out TIA(unlikely since lasted 2 days) or small stroke not seen on MRI or other etiology needs to complete workup.   Time Line  In 2015  patient had episodes of right-sided numbness face and right hemivbody, couldn't think, couldn;t speak. Had some mild right-sided weakness. No etiology found In the beginning of October 2024 had joint pain and fatigue and brain fog and inflammation  2 weeks ago started having the numbness right side of the body again, no pain didn't address again A week ago started having worse episode of right-sided numbness but also new symptoms of right arm pain, feeling dizzy, lightheaded, weakness of the right side has subsequently followed with rheumatology and cardiology No migraines, no headaches, no hx of seizure  Differential is wide: TIA/Stroke(unlikely but can't rule it out), Seizure(less likely but can't rule it out)? Peripheral nerve issue(unlikely)? Migraine aura without headache/hemiplegic migraine?   MRI brain and cervical spine without etiology  Complete stroke/TIA workup: Lipid panel, hgba1c - she will come back when fasting CTA H&N Echocardiogram w/ bubble - also reports chest pain and heart flutters 30-day cardiac event monitor (has feelings of flutter and has had chest pain) -  also reports chest pain and heart flutters  Seizures?: EEG in the office  Migraine aura w/o headache? In that case would call it hemiplegic migraine aure and treat with migraine acute or preventative medications but is a diagnosis of exclusion. Will try nurtec samples one daily for 8 days and see if this resolves current symptoms.   Peripheral nerve issue? EMG/NCS(likely low yield, will consider in the future)  Continue f/u with cardiology  and rheumatology  Orders Placed This Encounter  Procedures   CT ANGIO HEAD W OR WO CONTRAST   CT ANGIO NECK W OR WO CONTRAST   CK isoenzymes (brain, muscle injury)   Lipid Panel   Hemoglobin A1c   Cardiac event monitor   ECHOCARDIOGRAM COMPLETE BUBBLE STUDY   EEG adult   Meds ordered this encounter  Medications   Rimegepant Sulfate (NURTEC) 75 MG TBDP    Sig: Take one daily for the next 8 days    Cc: Cathren Laine, MD,  Physicians, [facility]  Naomie Dean, MD  [facility] [address] [place], [place] [address]  Phone [phone] Fax [phone]

## 2023-01-25 NOTE — Patient Instructions (Addendum)
Differential is wide: TIA/Stroke(unlikely but can't rule it out), Seizure? Peripheral nerve issue? Migraine aura without headache/hemiplegic migraine?   MRI brain and cervical spine without etiology  Complete stroke/TIA workup: Lipid panel, hgba1c - come back for blood work fasting  CTA H&N Echocardiogram w/ bubble 30-day cardiac event monitor (has feelings of flutter and has had chest pain)  Seizures?: EEG in the office   Migraine aura w/o headache? In that case would call it hemiplegic migraine aure and treat with migraine acute or preventative medications but is a diagnosis of exclusion  Peripheral nerve issue? EMG/NCS(likely low yield, will consider in the future)  Continue f/u with cardiology and rheumatology And see me back after workup/4 months and if all negative treat for migraine  If above negative would start migraine medication: Emgality, Ajovy or qulipta, nurtec, ubrelvy. May have to try topiramate based on insurance. I would try Qulipta first or nurtec every other day. But for the next 8 days take daily nurtec and see if symptoms resolve.   Hemiplegic migraine(linked to a gene and can be genetic) is a rare type of migraine that causes temporary weakness on one side of the body, along with other symptoms: Visual impairment Speech impairment Sensation impairment Ataxia Coma Paralysis  Hemiplegic migraines can be triggered by a number of things, including: Stress Changes in sleeping patterns Physical exertion Head trauma Bright lights Certain foods or changes to eating patterns  Symptoms can last for a few hours to days, and rarely up to four weeks. Muscle problems usually go away within 24 hours, but they may last a few days.  Hemiplegic migraines can be familial or sporadic:  Familial hemiplegic migraine: An autosomal dominant type of hemiplegic migraine that runs in families  Sporadic hemiplegic migraine: Occurs in one individual and has no history in first-degree  relatives  A healthcare provider can diagnose a hemiplegic migraine after a physical exam and neurological exam. Tests may include genetic tests, CT scans, MRI scans, lumbar puncture, and electroencephalogram  Rimegepant Disintegrating Tablets What is this medication? RIMEGEPANT (ri ME je pant) prevents and treats migraines. It works by blocking a substance in the body that causes migraines. This medicine may be used for other purposes; ask your health care provider or pharmacist if you have questions. COMMON BRAND NAME(S): NURTEC ODT What should I tell my care team before I take this medication? They need to know if you have any of these conditions: Kidney disease Liver disease An unusual or allergic reaction to rimegepant, other medications, foods, dyes, or preservatives Pregnant or trying to get pregnant Breast-feeding How should I use this medication? Take this medication by mouth. Take it as directed on the prescription label. Leave the tablet in the sealed pack until you are ready to take it. With dry hands, open the pack and gently remove the tablet. If the tablet breaks or crumbles, throw it away. Use a new tablet. Place the tablet in the mouth and allow it to dissolve. Then, swallow it. Do not cut, crush, or chew this medication. You do not need water to take this medication. Talk to your care team about the use of this medication in children. Special care may be needed. Overdosage: If you think you have taken too much of this medicine contact a poison control center or emergency room at once. NOTE: This medicine is only for you. Do not share this medicine with others. What if I miss a dose? This does not apply. This medication is not for  regular use. What may interact with this medication? Certain medications for fungal infections, such as fluconazole, itraconazole Rifampin This list may not describe all possible interactions. Give your health care provider a list of all the  medicines, herbs, non-prescription drugs, or dietary supplements you use. Also tell them if you smoke, drink alcohol, or use illegal drugs. Some items may interact with your medicine. What should I watch for while using this medication? Visit your care team for regular checks on your progress. Tell your care team if your symptoms do not start to get better or if they get worse. What side effects may I notice from receiving this medication? Side effects that you should report to your care team as soon as possible: Allergic reactions--skin rash, itching, hives, swelling of the face, lips, tongue, or throat Side effects that usually do not require medical attention (report to your care team if they continue or are bothersome): Nausea Stomach pain This list may not describe all possible side effects. Call your doctor for medical advice about side effects. You may report side effects to FDA at 1-800-FDA-1088. Where should I keep my medication? Keep out of the reach of children and pets. Store at room temperature between 20 and 25 degrees C (68 and 77 degrees F). Get rid of any unused medication after the expiration date. To get rid of medications that are no longer needed or have expired: Take the medication to a medication take-back program. Check with your pharmacy or law enforcement to find a location. If you cannot return the medication, check the label or package insert to see if the medication should be thrown out in the garbage or flushed down the toilet. If you are not sure, ask your care team. If it is safe to put it in the trash, take the medication out of the container. Mix the medication with cat litter, dirt, coffee grounds, or other unwanted substance. Seal the mixture in a bag or container. Put it in the trash. NOTE: This sheet is a summary. It may not cover all possible information. If you have questions about this medicine, talk to your doctor, pharmacist, or health care provider.  2024  Elsevier/Gold Standard (2021-03-25 00:00:00)

## 2023-01-26 ENCOUNTER — Other Ambulatory Visit: Payer: Self-pay | Admitting: Neurology

## 2023-01-26 DIAGNOSIS — G8191 Hemiplegia, unspecified affecting right dominant side: Secondary | ICD-10-CM

## 2023-01-26 DIAGNOSIS — G459 Transient cerebral ischemic attack, unspecified: Secondary | ICD-10-CM

## 2023-01-26 DIAGNOSIS — R002 Palpitations: Secondary | ICD-10-CM

## 2023-01-26 DIAGNOSIS — R200 Anesthesia of skin: Secondary | ICD-10-CM

## 2023-01-26 DIAGNOSIS — R2990 Unspecified symptoms and signs involving the nervous system: Secondary | ICD-10-CM

## 2023-01-26 DIAGNOSIS — R079 Chest pain, unspecified: Secondary | ICD-10-CM

## 2023-01-26 DIAGNOSIS — I498 Other specified cardiac arrhythmias: Secondary | ICD-10-CM

## 2023-01-27 LAB — PROTEIN ELECTROPHORESIS, SERUM, WITH REFLEX
Albumin ELP: 4.5 g/dL (ref 3.8–4.8)
Alpha 1: 0.3 g/dL (ref 0.2–0.3)
Alpha 2: 0.8 g/dL (ref 0.5–0.9)
Beta 2: 0.4 g/dL (ref 0.2–0.5)
Beta Globulin: 0.5 g/dL (ref 0.4–0.6)
Gamma Globulin: 0.9 g/dL (ref 0.8–1.7)
Total Protein: 7.4 g/dL (ref 6.1–8.1)

## 2023-01-27 LAB — BETA-2 GLYCOPROTEIN ANTIBODIES
Beta-2 Glyco 1 IgA: <2 U/mL (ref <20.0)
Beta-2 Glyco 1 IgM: <2 U/mL (ref <20.0)
Beta-2 Glyco I IgG: <2 U/mL (ref <20.0)

## 2023-01-27 LAB — IGG, IGA, IGM
IgG (Immunoglobin G), Serum: 1025 mg/dL (ref 600–1640)
IgM, Serum: 96 mg/dL (ref 50–300)
Immunoglobulin A: 236 mg/dL (ref 47–310)

## 2023-01-27 LAB — CARDIOLIPIN ANTIBODIES, IGG, IGM, IGA
Anticardiolipin IgA: <2 [APL'U]/mL (ref <20.0)
Anticardiolipin IgG: <2 [GPL'U]/mL (ref <20.0)
Anticardiolipin IgM: <2 [MPL'U]/mL (ref <20.0)

## 2023-01-27 LAB — C3 AND C4
C3 Complement: 170 mg/dL (ref 83–193)
C4 Complement: 28 mg/dL (ref 15–57)

## 2023-01-27 LAB — CYCLIC CITRUL PEPTIDE ANTIBODY, IGG: Cyclic Citrullin Peptide Ab: <16 U

## 2023-01-27 LAB — C-REACTIVE PROTEIN: CRP: 9 mg/L — ABNORMAL HIGH (ref <8.0)

## 2023-01-27 LAB — LUPUS ANTICOAGULANT EVAL W/ REFLEX
PTT-LA Screen: 27 s (ref <=40)
dRVVT: 34 s (ref <=45)

## 2023-01-27 LAB — SEDIMENTATION RATE: Sed Rate: 14 mm/h (ref 0–20)

## 2023-01-28 ENCOUNTER — Encounter: Payer: Self-pay | Admitting: Neurology

## 2023-01-28 ENCOUNTER — Telehealth: Payer: Self-pay | Admitting: Neurology

## 2023-01-28 NOTE — Telephone Encounter (Signed)
Patient sent an after hours call message re: L sided heaviness, CP and SOB.  I was able to talk to the patient. She states, that she had acute R sided symptoms about a week ago and had an MRI that was clear. Last night she woke up with L arm pain and now has arm heaviness and some SOB.  There was no dysarthria, while she spoke on the phone and she was able to speak in full sentences. She was advised to proceed to the ED for acute new Sx. She asked if she could decide not to go, and I told her it is her choice, but my medical recommendation is to get checked out. She stated that her doctor (Dr. Lucia Gaskins) was going to do a stroke work up. I advised her she could let the ED provider know, what she had discussed with her neurologist, but it would be up to their evaluation and her presentation, as to what w/u they would do in the ED. She demonstrated understanding and voiced agreement. Upon chart review: she sent a MyChart message around noon today as well and was advised to got to ER, which I second.

## 2023-01-29 ENCOUNTER — Telehealth: Payer: Self-pay | Admitting: Neurology

## 2023-01-29 DIAGNOSIS — I6389 Other cerebral infarction: Secondary | ICD-10-CM | POA: Diagnosis not present

## 2023-01-29 NOTE — Telephone Encounter (Signed)
I messaged patient about her recent episode:    I am so sorry you had symptoms again and the nurtec made it worse(that is very unusual and I wished it had worked!). I would like to try and treat you with a medication called Lamictal. It is an excellent medication for migraine aura. If you agree we can try it. Also I ordered the entire stroke evaluation but doesn't appear you have scheduled your CT angiograms and the other tests I ordered please check your voice mail for messages from the imaging center and cardiology for CT scans,  heart monitor and echocardiogram but it has only been a few days so they may not have gotten approval or other yet so watch out for the calls and let us know in a week If you have not heard from them. The emergency room and [facility] inpatient can get these tests done for you more quickly, but in outpatient clinic we have to wait for approval which is why we keep recommending you go to the ED. You also have not come back for the fasting blood work I ordered. Thank you, again so sorry you are dealing with this.  Dr. Lucia Gaskins  Dr. Rosamaria Lints to you above but if there is any other possible avenue we can can pursue that you suggest please let us know otherwise we will continue to treat for possible migraine aura, possible stroke/TIA and then consider other options such as EEG. The differential is wide for her variable and multiple somatic and neurologic complaints I fyou find anything pleae let me know! Thank you

## 2023-01-29 NOTE — Telephone Encounter (Signed)
Thank you Dr. Frances Furbish, I would like to start her on Lamictal, I think these may be migraine auras and Lamictal is a great medication for migraine auras(and also helps with mood which is never a bad thing!). I would like to start Lamictal with patient. I will send her a mychart message.

## 2023-01-30 ENCOUNTER — Other Ambulatory Visit: Payer: Self-pay | Admitting: Neurology

## 2023-01-30 DIAGNOSIS — G43101 Migraine with aura, not intractable, with status migrainosus: Secondary | ICD-10-CM

## 2023-01-30 MED ORDER — LAMOTRIGINE ER 50 MG PO TB24: ORAL_TABLET | ORAL | 2 refills | Status: AC

## 2023-01-31 ENCOUNTER — Telehealth: Payer: Self-pay | Admitting: Neurology

## 2023-01-31 NOTE — Telephone Encounter (Signed)
Patient spoke with Dr Frances Furbish over the weekend.

## 2023-01-31 NOTE — Telephone Encounter (Signed)
Yetta Numbers: [id number] exp. 01/31/23-03/01/23 sent to GI [phone]

## 2023-02-20 ENCOUNTER — Telehealth: Payer: Self-pay | Admitting: Neurology

## 2023-02-20 NOTE — Telephone Encounter (Signed)
 Spoke to patient rash improved. Can you move her up even with a video to a sooner appointment?

## 2023-02-21 NOTE — Telephone Encounter (Signed)
 Pt's appointment has been moved up to 04/19/23 at 8:30am for a video visit

## 2023-02-21 NOTE — Telephone Encounter (Signed)
 3/4 is fine thanks

## 2023-03-02 ENCOUNTER — Ambulatory Visit: Payer: 59 | Admitting: Neurology

## 2023-03-02 ENCOUNTER — Other Ambulatory Visit (INDEPENDENT_AMBULATORY_CARE_PROVIDER_SITE_OTHER): Payer: Self-pay

## 2023-03-02 DIAGNOSIS — G8191 Hemiplegia, unspecified affecting right dominant side: Secondary | ICD-10-CM

## 2023-03-02 DIAGNOSIS — R200 Anesthesia of skin: Secondary | ICD-10-CM | POA: Diagnosis not present

## 2023-03-02 DIAGNOSIS — Z0289 Encounter for other administrative examinations: Secondary | ICD-10-CM

## 2023-03-02 NOTE — Procedures (Signed)
    History:  33 year old woman with paroxysmal events concerning for tinnitus   EEG classification: Awake and drowsy  Duration: 26 minutes   Technical aspects: This EEG study was done with scalp electrodes positioned according to the 10-20 International system of electrode placement. Electrical activity was reviewed with band pass filter of 1-70Hz , sensitivity of 7 uV/mm, display speed of 72mm/sec with a 60Hz  notched filter applied as appropriate. EEG data were recorded continuously and digitally stored.   Description of the recording: The background rhythms of this recording consists of a fairly well modulated medium amplitude alpha rhythm of 9 Hz that is reactive to eye opening and closure. Present in the anterior head region is a 15-20 Hz beta activity. Photic stimulation was performed, did not show any abnormalities. Hyperventilation was also performed, did not show any abnormalities. Drowsiness was manifested by background fragmentation. No abnormal epileptiform discharges seen during this recording. There was no focal slowing. There were no electrographic seizure identified.   Abnormality: None   Impression: This is a normal EEG recorded while drowsy and awake. No evidence of interictal epileptiform discharges. Normal EEGs, however, do not rule out epilepsy.    Yobana Culliton, MD [facility]

## 2023-03-03 ENCOUNTER — Encounter: Payer: Self-pay | Admitting: Neurology

## 2023-03-08 LAB — LIPID PANEL
Chol/HDL Ratio: 3.7 {ratio} (ref 0.0–4.4)
Cholesterol, Total: 257 mg/dL — ABNORMAL HIGH (ref 100–199)
HDL: 70 mg/dL (ref >39)
LDL Chol Calc (NIH): 168 mg/dL — ABNORMAL HIGH (ref 0–99)
Triglycerides: 107 mg/dL (ref 0–149)
VLDL Cholesterol Cal: 19 mg/dL (ref 5–40)

## 2023-03-08 LAB — CK ISOENZYMES
CK-BB: 0 %
CK-MB: 0 % (ref 0–3)
CK-MM: 100 % (ref 97–100)
Macro Type 1: 0 %
Macro Type 2: 0 %
Total CK: 57 U/L (ref 32–182)

## 2023-03-08 LAB — HEMOGLOBIN A1C
Est. average glucose Bld gHb Est-mCnc: 105 mg/dL
Hgb A1c MFr Bld: 5.3 % (ref 4.8–5.6)

## 2023-03-10 ENCOUNTER — Encounter: Payer: Self-pay | Admitting: Neurology

## 2023-04-19 ENCOUNTER — Telehealth: Payer: BC Managed Care – PPO | Admitting: Neurology

## 2023-06-02 ENCOUNTER — Encounter: Payer: BC Managed Care – PPO | Admitting: Internal Medicine

## 2023-06-20 ENCOUNTER — Ambulatory Visit: Payer: BC Managed Care – PPO | Admitting: Neurology
